# Patient Record
Sex: Female | Born: 1945 | Race: White | Hispanic: No | Marital: Single | Smoking: Never smoker
Health system: Southern US, Community
[De-identification: ages and names within clinical notes are randomized; demographics above are authoritative.]

## PROBLEM LIST (undated history)

## (undated) DIAGNOSIS — E7800 Pure hypercholesterolemia, unspecified: Secondary | ICD-10-CM

## (undated) DIAGNOSIS — K529 Noninfective gastroenteritis and colitis, unspecified: Secondary | ICD-10-CM

## (undated) DIAGNOSIS — K219 Gastro-esophageal reflux disease without esophagitis: Secondary | ICD-10-CM

## (undated) DIAGNOSIS — E039 Hypothyroidism, unspecified: Secondary | ICD-10-CM

## (undated) DIAGNOSIS — M8580 Other specified disorders of bone density and structure, unspecified site: Secondary | ICD-10-CM

## (undated) HISTORY — DX: Gastro-esophageal reflux disease without esophagitis: K21.9

## (undated) HISTORY — DX: Hypothyroidism, unspecified: E03.9

## (undated) HISTORY — DX: Pure hypercholesterolemia, unspecified: E78.00

## (undated) HISTORY — DX: Noninfective gastroenteritis and colitis, unspecified: K52.9

## (undated) HISTORY — DX: Other specified disorders of bone density and structure, unspecified site: M85.80

---

## 1980-05-19 HISTORY — PX: OVARIAN CYST REMOVAL: SHX89

## 1999-05-15 ENCOUNTER — Other Ambulatory Visit: Admission: RE | Admit: 1999-05-15 | Discharge: 1999-05-15 | Payer: Self-pay | Admitting: Obstetrics and Gynecology

## 1999-08-13 ENCOUNTER — Other Ambulatory Visit: Admission: RE | Admit: 1999-08-13 | Discharge: 1999-08-13 | Payer: Self-pay | Admitting: Obstetrics and Gynecology

## 2000-01-20 ENCOUNTER — Other Ambulatory Visit: Admission: RE | Admit: 2000-01-20 | Discharge: 2000-01-20 | Payer: Self-pay | Admitting: Obstetrics and Gynecology

## 2000-01-20 ENCOUNTER — Encounter (INDEPENDENT_AMBULATORY_CARE_PROVIDER_SITE_OTHER): Payer: Self-pay | Admitting: Specialist

## 2000-05-27 ENCOUNTER — Other Ambulatory Visit: Admission: RE | Admit: 2000-05-27 | Discharge: 2000-05-27 | Payer: Self-pay | Admitting: Obstetrics and Gynecology

## 2000-09-21 ENCOUNTER — Encounter: Payer: Self-pay | Admitting: *Deleted

## 2000-09-21 ENCOUNTER — Encounter: Admission: RE | Admit: 2000-09-21 | Discharge: 2000-09-21 | Payer: Self-pay | Admitting: *Deleted

## 2001-10-25 ENCOUNTER — Other Ambulatory Visit: Admission: RE | Admit: 2001-10-25 | Discharge: 2001-10-25 | Payer: Self-pay | Admitting: *Deleted

## 2002-12-03 ENCOUNTER — Emergency Department (HOSPITAL_COMMUNITY): Admission: EM | Admit: 2002-12-03 | Discharge: 2002-12-03 | Payer: Self-pay | Admitting: Emergency Medicine

## 2002-12-03 ENCOUNTER — Encounter: Payer: Self-pay | Admitting: Emergency Medicine

## 2002-12-14 ENCOUNTER — Other Ambulatory Visit: Admission: RE | Admit: 2002-12-14 | Discharge: 2002-12-14 | Payer: Self-pay | Admitting: Family Medicine

## 2002-12-23 ENCOUNTER — Encounter: Admission: RE | Admit: 2002-12-23 | Discharge: 2002-12-23 | Payer: Self-pay

## 2003-01-05 ENCOUNTER — Encounter (INDEPENDENT_AMBULATORY_CARE_PROVIDER_SITE_OTHER): Payer: Self-pay | Admitting: *Deleted

## 2003-01-05 ENCOUNTER — Ambulatory Visit (HOSPITAL_COMMUNITY): Admission: RE | Admit: 2003-01-05 | Discharge: 2003-01-05 | Payer: Self-pay

## 2003-04-03 ENCOUNTER — Encounter: Admission: RE | Admit: 2003-04-03 | Discharge: 2003-04-03 | Payer: Self-pay | Admitting: Endocrinology

## 2003-05-20 DIAGNOSIS — M8580 Other specified disorders of bone density and structure, unspecified site: Secondary | ICD-10-CM

## 2003-05-20 HISTORY — DX: Other specified disorders of bone density and structure, unspecified site: M85.80

## 2003-08-21 ENCOUNTER — Encounter (HOSPITAL_COMMUNITY): Admission: RE | Admit: 2003-08-21 | Discharge: 2003-11-19 | Payer: Self-pay | Admitting: Endocrinology

## 2003-09-11 ENCOUNTER — Ambulatory Visit (HOSPITAL_COMMUNITY): Admission: RE | Admit: 2003-09-11 | Discharge: 2003-09-11 | Payer: Self-pay | Admitting: Endocrinology

## 2003-09-11 ENCOUNTER — Encounter (INDEPENDENT_AMBULATORY_CARE_PROVIDER_SITE_OTHER): Payer: Self-pay | Admitting: *Deleted

## 2003-11-09 ENCOUNTER — Other Ambulatory Visit: Admission: RE | Admit: 2003-11-09 | Discharge: 2003-11-09 | Payer: Self-pay | Admitting: Family Medicine

## 2004-05-08 ENCOUNTER — Ambulatory Visit (HOSPITAL_COMMUNITY): Admission: RE | Admit: 2004-05-08 | Discharge: 2004-05-08 | Payer: Self-pay | Admitting: Gastroenterology

## 2004-05-19 DIAGNOSIS — E039 Hypothyroidism, unspecified: Secondary | ICD-10-CM

## 2004-05-19 HISTORY — DX: Hypothyroidism, unspecified: E03.9

## 2004-12-24 ENCOUNTER — Other Ambulatory Visit: Admission: RE | Admit: 2004-12-24 | Discharge: 2004-12-24 | Payer: Self-pay | Admitting: Family Medicine

## 2005-03-26 ENCOUNTER — Encounter: Admission: RE | Admit: 2005-03-26 | Discharge: 2005-03-26 | Payer: Self-pay | Admitting: Family Medicine

## 2005-04-09 ENCOUNTER — Ambulatory Visit (HOSPITAL_COMMUNITY): Admission: RE | Admit: 2005-04-09 | Discharge: 2005-04-09 | Payer: Self-pay | Admitting: Family Medicine

## 2005-04-09 ENCOUNTER — Encounter (INDEPENDENT_AMBULATORY_CARE_PROVIDER_SITE_OTHER): Payer: Self-pay | Admitting: *Deleted

## 2005-05-19 HISTORY — PX: THYROIDECTOMY: SHX17

## 2005-08-12 ENCOUNTER — Ambulatory Visit (HOSPITAL_COMMUNITY): Admission: RE | Admit: 2005-08-12 | Discharge: 2005-08-13 | Payer: Self-pay | Admitting: Surgery

## 2005-08-12 ENCOUNTER — Encounter (INDEPENDENT_AMBULATORY_CARE_PROVIDER_SITE_OTHER): Payer: Self-pay | Admitting: *Deleted

## 2008-03-22 ENCOUNTER — Other Ambulatory Visit: Admission: RE | Admit: 2008-03-22 | Discharge: 2008-03-22 | Payer: Self-pay | Admitting: Obstetrics and Gynecology

## 2008-11-13 ENCOUNTER — Encounter: Admission: RE | Admit: 2008-11-13 | Discharge: 2009-02-11 | Payer: Self-pay | Admitting: Endocrinology

## 2010-06-09 ENCOUNTER — Encounter: Payer: Self-pay | Admitting: Endocrinology

## 2010-07-18 HISTORY — PX: OTHER SURGICAL HISTORY: SHX169

## 2010-10-04 NOTE — Op Note (Signed)
NAME:  Heidi [name], Heidi [name] NO.:  [id number]   MEDICAL RECORD NO.:  [id number]          PATIENT TYPE:  OIB   LOCATION:  5731                         FACILITY:  [facility]   PHYSICIAN:  Velora Heckler, MD      DATE OF BIRTH:  June 02, 1945   DATE OF PROCEDURE:  08/12/2005  DATE OF DISCHARGE:  08/13/2005                                 OPERATIVE REPORT   PREOPERATIVE DIAGNOSIS:  Multinodular thyroid goiter with compressive  symptoms.   POSTOPERATIVE DIAGNOSIS:  Multinodular thyroid goiter with compressive  symptoms.   PROCEDURE:  Total thyroidectomy.   SURGEON:  Velora Heckler, MD, FACS   ASSISTANT:  Lorelee New, MD, FACS   ANESTHESIA:  General.   ESTIMATED BLOOD LOSS:  Minimal.   PREPARATION:  Betadine.   COMPLICATIONS:  None.   INDICATIONS:  The patient is a 65 year old white female from [place],  [place] who presents at the request of Dr. Dorisann Frames with  multinodular goiter.  The patient had known goiter for many years.  It had  gradually increased in size.  She had developed borderline hyperthyroidism.  She developed compressive symptoms including dysphagia, discomfort, and  voice changes.  The patient now comes to surgery for resection.   BODY OF REPORT:  Procedure is done in OR #1 at the [facility].  The patient is brought to the operating room and placed in a  supine position on the operating room table.  Following the administration  of general anesthesia, the patient is positioned and then prepped and draped  in usual strict aseptic fashion.  After ascertaining that an adequate level  of anesthesia had been obtained, a Kocher incision is made with a #10 blade.  Dissection is carried down through subcutaneous tissues and platysma.  Hemostasis is obtained with the electrocautery.  Skin flaps are developed  cephalad and caudad from the thyroid notch to the sternal notch.  A malar  self-retaining retractor is placed for  exposure.  Strap muscles are incised  in the midline and dissection is begun on the left side.  The left thyroid  lobe is the most prominent.  It measures approximately 11 cm in greatest  dimension.  It extends from the level of the thyroid cartilage to the  anterior mediastinum and down to the aortic arch.  Strap muscles are  reflected laterally.  Middle thyroid vein is divided between medium  Ligaclips.  Using gentle blunt dissection with a Pension scheme manager, the lobe  is exposed.  The superior pole is dissected out initially.  There is a large  cystic nodule in the superior pole.  Superior parathyroid gland tissue is  identified and preserved on its vascular pedicle.  Superior pole vessels are  ligated in continuity with 2-0 silk ties and medium Ligaclips and divided.  Gland is rolled anteriorly.  Branches of the inferior thyroid artery are  divided between small and medium Ligaclips.  Recurrent nerve is identified  and preserved.  Gland is rolled medially.  Inferior venous tributaries are  ligated with 2-0 silk ties and divided.  Substernal component is also gently  mobilized with blunt dissection and delivered up out of the anterior  mediastinum into the neck.  Dissection is carried down to the ligament of  Berry.  Branches of the inferior thyroid artery are divided between small  Ligaclips.  Ligament of Allyson Sabal is transected with the electrocautery, taking  care to avoid the recurrent nerve.  Gland is then mobilized across the  midline.  It is excised off the anterior trachea.  Trachea is markedly  deviated to the right.  Dry packs are placed in the left neck.   Next, we turned our attention to the right thyroid lobe.  Right thyroid lobe  is markedly smaller.  There are less nodules.  There is a 1-cm nodule on the  inferior aspect of the right lobe.  Strap muscles are reflected laterally.  Middle thyroid vein is divided between medium Ligaclips.  Superior pole is  dissected out.   Superior pole vessels are ligated in continuity with 2-0  silk ties and medium Ligaclips and divided.  Gland is rolled anteriorly.  Branches of the inferior thyroid artery are divided between small Ligaclips.  Inferior venous tributaries are divided between medium Ligaclips.  Gland is  rolled anteriorly.  Recurrent nerve was identified and preserved.  Ligament  of Allyson Sabal is transected with the electrocautery and the gland is mobilized up  and onto the trachea, from which the gland is completely excised.  The  entire thyroid gland is then submitted to Pathology for review.  Neck is  irrigated copiously with warm saline.  Hemostasis was obtained with small  and medium Ligaclips.  Surgicel is placed over the area of the recurrent  nerve and parathyroid glands bilaterally.  Strap muscles are reapproximated  in the midline with interrupted 3-0 Vicryl sutures.  Platysma is closed with  interrupted 3-0 Vicryl sutures.  Skin is closed with a running 4-0 Vicryl  subcuticular suture.  Wound is washed and dried and Benzoin and Steri-Strips  are applied.  Sterile dressings are applied.  The patient is awakened from  anesthesia and brought to the recovery room in stable condition.  The  patient tolerated the procedure well.      Velora Heckler, MD  Electronically Signed     TMG/MEDQ  D:  08/12/2005  T:  08/13/2005  Job:  [id number]   cc:   Talmadge Coventry, M.D.  Fax: [phone]   Dorisann Frames, M.D.  Fax: [phone]

## 2010-10-04 NOTE — Op Note (Signed)
NAME:  Heidi [name], Heidi [name] NO.:  [id number]   MEDICAL RECORD NO.:  [id number]          PATIENT TYPE:  AMB   LOCATION:  ENDO                         FACILITY:  [facility]   PHYSICIAN:  Bernette Redbird, M.D.   DATE OF BIRTH:  [birth year]   DATE OF PROCEDURE:  05/08/2004  DATE OF DISCHARGE:                                 OPERATIVE REPORT   PROCEDURE:  Colonoscopy.   INDICATION:  Screening for colon cancer in an asymptomatic 65 year old  without risk factors for colon cancer.   FINDINGS:  Normal exam to the terminal ileum.   DESCRIPTION OF PROCEDURE:  The nature, purpose, and risks of the procedure  had been discussed who provided written consent.  Sedation was fentanyl 90  mcg and Versed 9 mg IV without arrhythmia or desaturation.  The Olympus  adult video colonoscope was advanced to the terminal ileum without  significant difficulty.  Pullback was then performed.  The TI in the colon  had a normal appearance.  The quality of the prep was excellent so it was  felt that all areas were well seen.   No polyps, cancer, colitis, vascular malformations, or diverticulosis were  noted.  Retroflexion showed some hypertrophy at anal papillae.  Re-  inspection of the rectum was normal.  No biopsies were obtained.  The  patient tolerated the procedure well and there were no apparent  complications.   IMPRESSION:  Normal screening exam in a standard-risk individual (V76.51).   PLAN:  Flexible sigmoidoscopy for continued screening in five years.       RB/MEDQ  D:  05/08/2004  T:  05/09/2004  Job:  [id number]   cc:   Talmadge Coventry, M.D.  [address]  [place]  [place] [address]  Fax: [phone]

## 2010-10-04 NOTE — Op Note (Signed)
NAME:  Heidi, [name] NO.:  [id number]   MEDICAL RECORD NO.:  [id number]          PATIENT TYPE:  AMB   LOCATION:  ENDO                         FACILITY:  [facility]   PHYSICIAN:  Bernette Redbird, M.D.   DATE OF BIRTH:  06/30/1945   DATE OF PROCEDURE:  05/08/2004  DATE OF DISCHARGE:                                 OPERATIVE REPORT   PROCEDURE:  Upper endoscopy.   INDICATIONS:  Reflux symptoms, adequately controlled by Nexium.   FINDINGS:  Small hiatal hernia.   PROCEDURE:  The nature, purpose, and risks of the procedure had been  discussed with the patient, who provided written consent.  Sedation was  fentanyl 50 mcg and Versed 6 mg IV without arrhythmias or desaturation.  The  Olympus XQ-140 small-caliber adult video endoscope was passed under direct  vision.  The vocal cords were not well-seen.  The esophagus was readily  entered and was normal in its entirety.  The squamocolumnar junction was  very crisp and clear.  There was no evidence of free reflux, reflux  esophagitis, Barrett's esophagus, varices, infection, or neoplasia.  No ring  or stricture was evident.  There was a 1-2 cm hiatal hernia.   The stomach contained a small bilious residual.  No gastritis, erosions,  ulcers, polyps, or masses were observed, and the pylorus, duodenal bulb, and  second duodenum looked normal.  Retroflexion showed a normal cardia with a  fairly snug diaphragmatic hiatus and no obvious hiatal hernia seen on  retroflex viewing.   The scope was removed from the patient.  No biopsies were obtained.  She  tolerated the procedure well, and there were no apparent complications.   IMPRESSION:  Reflux, without worrisome findings on current examination.  Minimal hiatal hernia present (530.81).   PLAN:  Continue PPI therapy as needed to control symptoms.       RB/MEDQ  D:  05/08/2004  T:  05/08/2004  Job:  [id number]   cc:   Talmadge Coventry, M.D.  [address]   [place]  [place] [address]  Fax: [phone]

## 2011-12-16 ENCOUNTER — Other Ambulatory Visit: Payer: Self-pay | Admitting: Ophthalmology

## 2012-05-18 ENCOUNTER — Other Ambulatory Visit: Payer: Self-pay | Admitting: Dermatology

## 2012-08-30 ENCOUNTER — Encounter: Payer: Self-pay | Admitting: *Deleted

## 2012-08-31 ENCOUNTER — Ambulatory Visit (INDEPENDENT_AMBULATORY_CARE_PROVIDER_SITE_OTHER): Payer: BC Managed Care – PPO | Admitting: Obstetrics and Gynecology

## 2012-08-31 VITALS — BP 130/74 | Ht 61.5 in | Wt 192.8 lb

## 2012-08-31 DIAGNOSIS — Z0000 Encounter for general adult medical examination without abnormal findings: Secondary | ICD-10-CM

## 2012-08-31 DIAGNOSIS — Z01419 Encounter for gynecological examination (general) (routine) without abnormal findings: Secondary | ICD-10-CM

## 2012-08-31 LAB — POCT URINALYSIS DIPSTICK
Leukocytes, UA: NEGATIVE
pH, UA: 7

## 2012-08-31 NOTE — Patient Instructions (Addendum)

## 2012-08-31 NOTE — Progress Notes (Signed)
67 y.o.  Divorced Caucasian female   G0P0. here for annual exam.  No vaginal bleeding.  No health changes.  Has taken on more responsibility at work, which she always promised herself she wouldn't do, but that extra work should end soon and she will have the summer off.    Patient's last menstrual period was 06/07/2012.          Sexually active: no  The current method of family planning is post menopausal status.    Exercising: no Last mammogram:  10/2011  Last pap smear: 04/22/10 History of abnormal pap: none  Smoking: no Alcohol: rarely Last colonoscopy: 07/2010 Last Bone Density: 06/2010  Last tetanus shot: 03/29/08 Last cholesterol check: unsure 2013   Hgb:   PCP             Urine: NEG    Health Maintenance  Topic Date Due  . Tetanus/tdap  04/25/1967  . Mammogram  04/24/1998  . Colonoscopy  04/24/1998  . Zostavax  04/24/2008  . Influenza Vaccine  01/17/2013  . Pap Smear  04/22/2013    No family history on file.  There is no problem list on file for this patient.   Past Medical History  Diagnosis Date  . Osteopenia 2005  . GERD (gastroesophageal reflux disease)   . Hypothyroidism 2006    Past Surgical History  Procedure Laterality Date  . Ovarian cyst removal  1982    benign  . Colonoscopy  07/2010    normal     Allergies: Codone  Current Outpatient Prescriptions  Medication Sig Dispense Refill  . COD LIVER OIL PO Take by mouth daily.      Marland Kitchen dexlansoprazole (DEXILANT) 60 MG capsule Take 60 mg by mouth daily.      Marland Kitchen ezetimibe-simvastatin (VYTORIN) 10-40 MG per tablet Take 1 tablet by mouth daily.      Marland Kitchen levothyroxine (SYNTHROID) 100 MCG tablet Take 100 mcg by mouth daily.      . Misc Natural Products (OSTEO BI-FLEX ADV DOUBLE ST PO) Take by mouth daily.      . Multiple Vitamins-Minerals (MULTIVITAMIN PO) Take by mouth daily.      . Vitamin D, Ergocalciferol, (DRISDOL) 50000 UNITS CAPS Take 50,000 Units by mouth every 7 (seven) days.       No current  facility-administered medications for this visit.    ROS: Pertinent items are noted in HPI.  Social ZO:XWRUEAVW, no children, taking care of her mom and dad who are both in their late 43's.  Works as an Secondary school teacher at [employer]    Exam:    BP 130/74  Ht 5' 1.5" (1.562 m)  Wt 192 lb 12.8 oz (87.454 kg)  BMI 35.84 kg/m2  LMP 06/07/2012  Down 8 pounds from last year Wt Readings from Last 3 Encounters:  08/31/12 192 lb 12.8 oz (87.454 kg)     Ht Readings from Last 3 Encounters:  08/31/12 5' 1.5" (1.562 m)  Height stable from last year.   General appearance: alert, cooperative and appears stated age Head: Normocephalic, without obvious abnormality, atraumatic Neck: no adenopathy, supple, symmetrical, trachea midline and thyroid not enlarged, symmetric, no tenderness/mass/nodules Lungs: clear to auscultation bilaterally Breasts: Inspection negative, No nipple retraction or dimpling, No nipple discharge or bleeding, No axillary or supraclavicular adenopathy, Normal to palpation without dominant masses Heart: regular rate and rhythm Abdomen: soft, non-tender; bowel sounds normal; no masses,  no organomegaly Extremities: extremities normal, atraumatic, no cyanosis or edema Skin: Skin color, texture, turgor  normal. No rashes or lesions Lymph nodes: Cervical, supraclavicular, and axillary nodes normal. No abnormal inguinal nodes palpated Neurologic: Grossly normal   Pelvic: External genitalia:  no lesions              Urethra:  normal appearing urethra with no masses, tenderness or lesions              Bartholins and Skenes: normal                 Vagina: normal appearing vagina with normal color and discharge, no lesions              Cervix: normal appearance              Pap taken: yes        Bimanual Exam:  Uterus:  uterus is normal size, shape, consistency and nontender, mid position , mobile                                      Adnexa: normal adnexa in size, nontender and no masses                                       Rectovaginal: Confirms                                      Anus:  normal sphincter tone, no lesions  A: normal menopausal exam, no HRT     Hypothyroid, see Dr. Horald Pollen     Elevated chol, GERD see Shaune Pollack    P: mammogram pap smear counseled on breast self exam, mammography screening, adequate intake of calcium and vitamin D, diet and exercise return annually or prn   Consider BMD next year   An After Visit Summary was printed and given to the patient.

## 2012-09-03 LAB — IPS PAP TEST WITH HPV

## 2012-09-06 ENCOUNTER — Encounter: Payer: Self-pay | Admitting: *Deleted

## 2013-09-16 ENCOUNTER — Ambulatory Visit: Payer: BC Managed Care – PPO | Admitting: Obstetrics and Gynecology

## 2013-09-22 ENCOUNTER — Telehealth: Payer: Self-pay | Admitting: Obstetrics and Gynecology

## 2013-09-22 NOTE — Telephone Encounter (Signed)
Confirming patient appt

## 2013-09-23 ENCOUNTER — Ambulatory Visit: Payer: BC Managed Care – PPO | Admitting: Obstetrics and Gynecology

## 2013-09-27 NOTE — Telephone Encounter (Signed)
Pt confirmed

## 2013-09-29 ENCOUNTER — Ambulatory Visit (INDEPENDENT_AMBULATORY_CARE_PROVIDER_SITE_OTHER): Payer: BC Managed Care – PPO | Admitting: Obstetrics and Gynecology

## 2013-09-29 ENCOUNTER — Encounter: Payer: Self-pay | Admitting: Obstetrics and Gynecology

## 2013-09-29 VITALS — BP 140/74 | HR 84 | Temp 98.0°F | Ht 61.25 in | Wt 202.8 lb

## 2013-09-29 DIAGNOSIS — Z01419 Encounter for gynecological examination (general) (routine) without abnormal findings: Secondary | ICD-10-CM

## 2013-09-29 DIAGNOSIS — E559 Vitamin D deficiency, unspecified: Secondary | ICD-10-CM

## 2013-09-29 DIAGNOSIS — Z0000 Encounter for general adult medical examination without abnormal findings: Secondary | ICD-10-CM

## 2013-09-29 LAB — CBC
HEMATOCRIT: 39.3 % (ref 36.0–46.0)
Hemoglobin: 13.3 g/dL (ref 12.0–15.0)
MCH: 30.4 pg (ref 26.0–34.0)
MCHC: 33.8 g/dL (ref 30.0–36.0)
MCV: 89.7 fL (ref 78.0–100.0)
PLATELETS: 310 10*3/uL (ref 150–400)
RBC: 4.38 MIL/uL (ref 3.87–5.11)
RDW: 13.6 % (ref 11.5–15.5)
WBC: 5.2 10*3/uL (ref 4.0–10.5)

## 2013-09-29 LAB — HEMOGLOBIN, FINGERSTICK: HEMOGLOBIN, FINGERSTICK: 13.9 g/dL (ref 12.0–16.0)

## 2013-09-29 LAB — POCT URINALYSIS DIPSTICK
BILIRUBIN UA: NEGATIVE
GLUCOSE UA: NEGATIVE
Ketones, UA: NEGATIVE
Nitrite, UA: NEGATIVE
Protein, UA: NEGATIVE
RBC UA: NEGATIVE
UROBILINOGEN UA: NEGATIVE
pH, UA: 6.5

## 2013-09-29 NOTE — Progress Notes (Signed)
Patient ID: Heidi Frank, female   DOB: 03/17/1946, 68 y.o.   MRN: [id number] GYNECOLOGY VISIT  PCP:  Shaune Pollackonna Gates, MD  Referring provider:   HPI: 68 y.o.   Single  Caucasian  female   G0P0 with LMP 2001.  here for annual exam Requesting pap.   Wants to work on weight loss through diet and exercise.   Retired and Geophysical data processorsold house. May move to be closer to her parents.  Hgb:  13.9 Urine:  Trace leuks - asymptomatic.   No UTIs.    GYNECOLOGIC HISTORY: Patient's last menstrual period was 06/07/2012.  Menopausal hormone therapy: never.  GYN procedures and prior surgeries:  Laparoscopy for left ovarian cystectomy - benign - early 1980s. Last mammogram:    June 2014 - [facility].               Last pap and high risk HPV testing:   08/2012 - WNL, negative HR HPV History of abnormal pap smear:  May have had one remote abnormal pap.  No treatments.    OB History   Grav Para Term Preterm Abortions TAB SAB Ect Mult Living   0                LIFESTYLE: Exercise: no            Tobacco: no Alcohol: no Drug use: no  OTHER HEALTH MAINTENANCE: Tetanus/TDap:  03/29/08 Gardisil:  NA Influenza:  2014 Zostavax:  2010  Bone density: 2/12 - Dr. Romero BellingBalin - now normal.  Charlies Constableook Boniva in the past for a short period of time.  Chose to stop. Off Vit D Rx. Colonoscopy:  3/12 - WNL  Cholesterol check: - wants check  Family History  Problem Relation Age of Onset  . Heart failure Mother   . Melanoma Father   . Osteoarthritis Father   . Heart attack Brother   . Esophageal cancer Maternal Aunt   . Breast cancer Maternal Grandmother   . Kidney disease Maternal Grandfather   . Colon cancer Paternal Grandfather     There are no active problems to display for this patient.  Past Medical History  Diagnosis Date  . Osteopenia 2005  . GERD (gastroesophageal reflux disease)   . Hypothyroidism 2006    Past Surgical History  Procedure Laterality Date  . Ovarian cyst removal  1982    benign  .  Colonoscopy  07/2010    normal   . Thyroidectomy  2007    ALLERGIES: Codone  Current Outpatient Prescriptions  Medication Sig Dispense Refill  . COD LIVER OIL PO Take by mouth daily.      Marland Kitchen. dexlansoprazole (DEXILANT) 60 MG capsule Take 60 mg by mouth daily.      Marland Kitchen. ezetimibe-simvastatin (VYTORIN) 10-40 MG per tablet Take 1 tablet by mouth daily.      Marland Kitchen. levocetirizine (XYZAL) 5 MG tablet Take 5 mg by mouth every evening.      Marland Kitchen. levothyroxine (SYNTHROID) 100 MCG tablet Take 100 mcg by mouth daily.      . Misc Natural Products (OSTEO BI-FLEX ADV DOUBLE ST PO) Take by mouth daily.      . Multiple Vitamins-Minerals (MULTIVITAMIN PO) Take by mouth daily.       No current facility-administered medications for this visit.     ROS:  Pertinent items are noted in HPI.  SOCIAL HISTORY:  Single,  Retired. May move to be closer to parents.   PHYSICAL EXAMINATION:    BP 140/74  Pulse  84  Temp(Src) 98 F (36.7 C)  Ht 5' 1.25" (1.556 m)  Wt 202 lb 12.8 oz (91.989 kg)  BMI 37.99 kg/m2  LMP 06/07/2012   Wt Readings from Last 3 Encounters:  09/29/13 202 lb 12.8 oz (91.989 kg)  08/31/12 192 lb 12.8 oz (87.454 kg)     Ht Readings from Last 3 Encounters:  09/29/13 5' 1.25" (1.556 m)  08/31/12 5' 1.5" (1.562 m)    General appearance: alert, cooperative and appears stated age Head: Normocephalic, without obvious abnormality, atraumatic Neck: no adenopathy, supple, symmetrical, trachea midline and thyroid not enlarged, symmetric, no tenderness/mass/nodules Lungs: clear to auscultation bilaterally Breasts: Inspection negative, No nipple retraction or dimpling, No nipple discharge or bleeding, No axillary or supraclavicular adenopathy, Normal to palpation without dominant masses Heart: regular rate and rhythm Abdomen: soft, non-tender; no masses,  no organomegaly Extremities: extremities normal, atraumatic, no cyanosis or edema Skin: Skin color, texture, turgor normal. No rashes or  lesions Lymph nodes: Cervical, supraclavicular, and axillary nodes normal. No abnormal inguinal nodes palpated Neurologic: Grossly normal  Pelvic: External genitalia:  no lesions              Urethra:  normal appearing urethra with no masses, tenderness or lesions              Bartholins and Skenes: normal                 Vagina: normal appearing vagina with atrophy noted.               Cervix: normal appearance              Pap and high risk HPV testing done: yes.            Bimanual Exam:  Uterus:  uterus is normal size, shape, consistency and nontender                                      Adnexa: normal adnexa in size, nontender and no masses                                      Rectovaginal: Confirms                                      Anus:  normal sphincter tone, no lesions  ASSESSMENT  Normal gynecologic exam. Vaginal atrophy.  Status post thyroidectomy.  On Synthroid.  Obesity.   PLAN  Mammogram recommended yearly.  Pap smear and high risk HPV testing performed. Declines vaginal estrogen treatment for atrophy.  Lipid profile, CMP, Vit D level. Thyroid care with Dr. Romero BellingBalin. Counseled on self breast exam, Calcium and vitamin D intake, exercise, weight loss. Return annually or prn   An After Visit Summary was printed and given to the patient.

## 2013-09-29 NOTE — Progress Notes (Deleted)
Patient ID: Heidi BoatmanJanie S Frank, female   DOB: 05/26/1945, 68 y.o.   MRN: [id number] 68 y.o.   Single    Caucasian   female   No obstetric history on file.   here for annual exam.    Patient's last menstrual period was 06/07/2012.          Sexually active: {yes no:314532}  The current method of family planning is {contraception:315051}.    Exercising:  Last mammogram:   Last pap smear: History of abnormal pap:  Smoking: Alcohol: Last colonoscopy: Last Bone Density:   Last tetanus shot: Last cholesterol check:   Hgb:                Urine:   No family history on file.  There are no active problems to display for this patient.   Past Medical History  Diagnosis Date  . Osteopenia 2005  . GERD (gastroesophageal reflux disease)   . Hypothyroidism 2006    Past Surgical History  Procedure Laterality Date  . Ovarian cyst removal  1982    benign  . Colonoscopy  07/2010    normal     Allergies: Codone  Current Outpatient Prescriptions  Medication Sig Dispense Refill  . COD LIVER OIL PO Take by mouth daily.      Marland Kitchen. dexlansoprazole (DEXILANT) 60 MG capsule Take 60 mg by mouth daily.      Marland Kitchen. ezetimibe-simvastatin (VYTORIN) 10-40 MG per tablet Take 1 tablet by mouth daily.      Marland Kitchen. levothyroxine (SYNTHROID) 100 MCG tablet Take 100 mcg by mouth daily.      . Misc Natural Products (OSTEO BI-FLEX ADV DOUBLE ST PO) Take by mouth daily.      . Multiple Vitamins-Minerals (MULTIVITAMIN PO) Take by mouth daily.      . Vitamin D, Ergocalciferol, (DRISDOL) 50000 UNITS CAPS Take 50,000 Units by mouth every 7 (seven) days.       No current facility-administered medications for this visit.    ROS: Pertinent items are noted in HPI.  Social Hx:    Exam:    BP 140/74  Pulse 84  Temp(Src) 98 F (36.7 C)  Ht 5' 1.25" (1.556 m)  Wt 202 lb 12.8 oz (91.989 kg)  BMI 37.99 kg/m2  LMP 06/07/2012   Wt Readings from Last 3 Encounters:  09/29/13 202 lb 12.8 oz (91.989 kg)  08/31/12 192 lb 12.8 oz  (87.454 kg)     Ht Readings from Last 3 Encounters:  09/29/13 5' 1.25" (1.556 m)  08/31/12 5' 1.5" (1.562 m)    General appearance: alert, cooperative and appears stated age Head: Normocephalic, without obvious abnormality, atraumatic Neck: no adenopathy, supple, symmetrical, trachea midline and thyroid not enlarged, symmetric, no tenderness/mass/nodules Lungs: clear to auscultation bilaterally Breasts: Inspection negative, No nipple retraction or dimpling, No nipple discharge or bleeding, No axillary or supraclavicular adenopathy, Normal to palpation without dominant masses Heart: regular rate and rhythm Abdomen: soft, non-tender; bowel sounds normal; no masses,  no organomegaly Extremities: extremities normal, atraumatic, no cyanosis or edema Skin: Skin color, texture, turgor normal. No rashes or lesions Lymph nodes: Cervical, supraclavicular, and axillary nodes normal. No abnormal inguinal nodes palpated Neurologic: Grossly normal   Pelvic: External genitalia:  no lesions              Urethra:  normal appearing urethra with no masses, tenderness or lesions              Bartholins and Skenes: normal  Vagina: normal appearing vagina with normal color and discharge, no lesions              Cervix: normal appearance              Pap taken: {yes no:314532}        Bimanual Exam:  Uterus:  uterus is normal size, shape, consistency and nontender                                      Adnexa: normal adnexa in size, nontender and no masses                                      Rectovaginal: Confirms                                      Anus:  normal sphincter tone, no lesions  A: normal menopausal exam     P:     {plan; gyn:5269::"mammogram","pap smear","return annually or prn"}     An After Visit Summary was printed and given to the patient.

## 2013-09-29 NOTE — Patient Instructions (Signed)

## 2013-09-30 LAB — COMPREHENSIVE METABOLIC PANEL
ALK PHOS: 73 U/L (ref 39–117)
ALT: 21 U/L (ref 0–35)
AST: 27 U/L (ref 0–37)
Albumin: 4.4 g/dL (ref 3.5–5.2)
BUN: 12 mg/dL (ref 6–23)
CALCIUM: 9.6 mg/dL (ref 8.4–10.5)
CO2: 25 mEq/L (ref 19–32)
Chloride: 102 mEq/L (ref 96–112)
Creat: 0.66 mg/dL (ref 0.50–1.10)
GLUCOSE: 94 mg/dL (ref 70–99)
POTASSIUM: 4 meq/L (ref 3.5–5.3)
SODIUM: 139 meq/L (ref 135–145)
Total Bilirubin: 0.4 mg/dL (ref 0.2–1.2)
Total Protein: 7.1 g/dL (ref 6.0–8.3)

## 2013-09-30 LAB — LIPID PANEL
CHOL/HDL RATIO: 2.9 ratio
CHOLESTEROL: 134 mg/dL (ref 0–200)
HDL: 47 mg/dL (ref >39)
LDL CALC: 66 mg/dL (ref 0–99)
TRIGLYCERIDES: 107 mg/dL (ref <150)
VLDL: 21 mg/dL (ref 0–40)

## 2013-09-30 LAB — VITAMIN D 25 HYDROXY (VIT D DEFICIENCY, FRACTURES): VIT D 25 HYDROXY: 35 ng/mL (ref 30–89)

## 2013-10-03 ENCOUNTER — Telehealth: Payer: Self-pay

## 2013-10-03 NOTE — Telephone Encounter (Signed)
Message copied by Alphonsa OverallIXON, Bevin Mayall L on Mon Oct 03, 2013 10:37 AM ------      Message from: Ricki MillerAMUNDSON DE Gwenevere GhaziARVALHO E SILVA, BROOK E      Created: Fri Sep 30, 2013  7:52 AM       Please report normal blood work to patient -       Normal cholesterol, metabolic profile, Vit D, and CBC. ------

## 2013-10-03 NOTE — Telephone Encounter (Signed)
LMOVM to call to discuss lab results.

## 2013-10-03 NOTE — Telephone Encounter (Signed)
Patient notified labs normal.

## 2013-10-05 LAB — IPS PAP TEST WITH HPV

## 2013-12-01 ENCOUNTER — Emergency Department (HOSPITAL_COMMUNITY): Payer: PRIVATE HEALTH INSURANCE

## 2013-12-01 ENCOUNTER — Encounter (HOSPITAL_COMMUNITY): Payer: Self-pay | Admitting: Emergency Medicine

## 2013-12-01 ENCOUNTER — Emergency Department (HOSPITAL_COMMUNITY)
Admission: EM | Admit: 2013-12-01 | Discharge: 2013-12-01 | Disposition: A | Discharge status: Home / Self Care | Payer: PRIVATE HEALTH INSURANCE | Attending: Emergency Medicine | Admitting: Emergency Medicine

## 2013-12-01 DIAGNOSIS — A084 Viral intestinal infection, unspecified: Secondary | ICD-10-CM

## 2013-12-01 DIAGNOSIS — K219 Gastro-esophageal reflux disease without esophagitis: Secondary | ICD-10-CM | POA: Insufficient documentation

## 2013-12-01 DIAGNOSIS — A088 Other specified intestinal infections: Secondary | ICD-10-CM | POA: Insufficient documentation

## 2013-12-01 DIAGNOSIS — E039 Hypothyroidism, unspecified: Secondary | ICD-10-CM | POA: Insufficient documentation

## 2013-12-01 DIAGNOSIS — Z8739 Personal history of other diseases of the musculoskeletal system and connective tissue: Secondary | ICD-10-CM | POA: Insufficient documentation

## 2013-12-01 DIAGNOSIS — R42 Dizziness and giddiness: Secondary | ICD-10-CM | POA: Insufficient documentation

## 2013-12-01 DIAGNOSIS — Z79899 Other long term (current) drug therapy: Secondary | ICD-10-CM | POA: Insufficient documentation

## 2013-12-01 LAB — CBC
HCT: 43.1 % (ref 36.0–46.0)
Hemoglobin: 14.5 g/dL (ref 12.0–15.0)
MCH: 31 pg (ref 26.0–34.0)
MCHC: 33.6 g/dL (ref 30.0–36.0)
MCV: 92.1 fL (ref 78.0–100.0)
PLATELETS: 269 10*3/uL (ref 150–400)
RBC: 4.68 MIL/uL (ref 3.87–5.11)
RDW: 12.6 % (ref 11.5–15.5)
WBC: 10.1 10*3/uL (ref 4.0–10.5)

## 2013-12-01 LAB — HEPATIC FUNCTION PANEL
ALT: 17 U/L (ref 0–35)
AST: 20 U/L (ref 0–37)
Albumin: 4.1 g/dL (ref 3.5–5.2)
Alkaline Phosphatase: 87 U/L (ref 39–117)
Bilirubin, Direct: <0.2 mg/dL (ref 0.0–0.3)
Total Bilirubin: 0.2 mg/dL — ABNORMAL LOW (ref 0.3–1.2)
Total Protein: 7.6 g/dL (ref 6.0–8.3)

## 2013-12-01 LAB — BASIC METABOLIC PANEL
Anion gap: 16 — ABNORMAL HIGH (ref 5–15)
BUN: 14 mg/dL (ref 6–23)
CALCIUM: 9.5 mg/dL (ref 8.4–10.5)
CO2: 25 meq/L (ref 19–32)
Chloride: 103 mEq/L (ref 96–112)
Creatinine, Ser: 0.47 mg/dL — ABNORMAL LOW (ref 0.50–1.10)
GFR calc non Af Amer: >90 mL/min (ref >90)
Glucose, Bld: 136 mg/dL — ABNORMAL HIGH (ref 70–99)
POTASSIUM: 3.8 meq/L (ref 3.7–5.3)
SODIUM: 144 meq/L (ref 137–147)

## 2013-12-01 LAB — LIPASE, BLOOD: LIPASE: 24 U/L (ref 11–59)

## 2013-12-01 MED ORDER — ONDANSETRON HCL 4 MG PO TABS: 4.0000 mg | ORAL_TABLET | Freq: Four times a day (QID) | ORAL | Status: AC

## 2013-12-01 MED ORDER — ONDANSETRON 4 MG PO TBDP
4.0000 mg | ORAL_TABLET | Freq: Once | ORAL | Status: AC
  Administered 2013-12-01: 4 mg via ORAL
  Filled 2013-12-01: qty 1

## 2013-12-01 MED ORDER — SODIUM CHLORIDE 0.9 % IV BOLUS (SEPSIS)
1000.0000 mL | Freq: Once | INTRAVENOUS | Status: AC
  Administered 2013-12-01: 1000 mL via INTRAVENOUS

## 2013-12-01 MED ORDER — GI COCKTAIL ~~LOC~~
30.0000 mL | Freq: Once | ORAL | Status: AC
  Administered 2013-12-01: 30 mL via ORAL
  Filled 2013-12-01: qty 30

## 2013-12-01 MED ORDER — IBUPROFEN 800 MG PO TABS
800.0000 mg | ORAL_TABLET | Freq: Once | ORAL | Status: AC
  Administered 2013-12-01: 800 mg via ORAL
  Filled 2013-12-01: qty 1

## 2013-12-01 NOTE — ED Provider Notes (Signed)
Pt seen and evaluated.  Discussed with PA Neva SeatGreene.  Nausea resolved.  Pt denies diarrhea.  Taking PO fluids.  Benign, non-tender abdomen. Reassuring labs. Plan to Dc home with symptomatic treatment for nausea/vomiting.  Rolland PorterMark Illana Nolting, MD 12/01/13 2006

## 2013-12-01 NOTE — ED Notes (Signed)
Pt c/o nausea and lightheadedness since yesterday morning. Pt sts the lightheaded feeling increases with movement and standing up. Pt reports she has also been having diarrhea and chills, denies taking temperature at home. Denies abd pain. Denies blood in stool/emesis. Nad, skin warm and dry, resp e/u.

## 2013-12-01 NOTE — ED Notes (Signed)
Tiffany, PA at bedside.

## 2013-12-01 NOTE — ED Notes (Signed)
Brought pt to room via wheelchair with family in tow; pt undressed, in gown, on monitor, continuous pulse oximetry and blood pressure cuff; family at bedside; Chastity, NT aware pt is in room

## 2013-12-01 NOTE — ED Provider Notes (Signed)
CSN: [id number]     Arrival date & time 12/01/13  1340 History   First MD Initiated Contact with Patient 12/01/13 1726     Chief Complaint  Patient presents with  . Nausea  . Dizziness  . Headache  . Diarrhea     (Consider location/radiation/quality/duration/timing/severity/associated sxs/prior Treatment) HPI  The patient presents to the emergency department for evaluation of nausea and vomiting that started yesterday morning She does not remember eating anything left over or undercooked. She has had some cramping prior to vomiting but no abdominal pains. She has also had intermittent loose stool. She feels as though she has been unable to keep down any PO. She has had an ovary removed in the past but no other abdominal surgery.   She has developed associated light headedness when changing positions only and chills. She has not had fevers, bloody bowel or vomiting, abdominal pains, weakness, confusion, distention, lower extremity swelling, CP, SOB.  Past Medical History  Diagnosis Date  . Osteopenia 2005  . GERD (gastroesophageal reflux disease)   . Hypothyroidism 2006   Past Surgical History  Procedure Laterality Date  . Ovarian cyst removal  1982    benign  . Colonoscopy  07/2010    normal   . Thyroidectomy  2007   Family History  Problem Relation Age of Onset  . Heart failure Mother   . Melanoma Father   . Osteoarthritis Father   . Heart attack Brother   . Esophageal cancer Maternal Aunt   . Breast cancer Maternal Grandmother   . Kidney disease Maternal Grandfather   . Colon cancer Paternal Grandfather    History  Substance Use Topics  . Smoking status: Never Smoker   . Smokeless tobacco: Not on file  . Alcohol Use: No   OB History   Grav Para Term Preterm Abortions TAB SAB Ect Mult Living   0              Review of Systems   Review of Systems  Gen: no weight loss, fevers,  night sweats + chills, Eyes: no discharge or drainage, no occular pain or visual  changes  Nose: no epistaxis or rhinorrhea  Mouth: no dental pain, no sore throat  Neck: no neck pain  Lungs:No wheezing, coughing or hemoptysis CV: no chest pain, palpitations, dependent edema or orthopnea  Abd: +  nausea, vomiting, diarrhea  No abdominal pain, GU: no dysuria or gross hematuria  MSK:  No muscle weakness or pain Neuro: no headache, no focal neurologic deficits + positional lightheadedness  Skin: no rash or wounds Psyche: no complaints    Allergies  Codone  Home Medications   Prior to Admission medications   Medication Sig Start Date End Date Taking? Authorizing Provider  Cholecalciferol (VITAMIN D) 2000 UNITS CAPS Take 1 capsule by mouth daily.   Yes Historical Provider, MD  COD LIVER OIL PO Take 1 tablet by mouth daily.    Yes Historical Provider, MD  dexlansoprazole (DEXILANT) 60 MG capsule Take 60 mg by mouth daily.   Yes Historical Provider, MD  ezetimibe-simvastatin (VYTORIN) 10-40 MG per tablet Take 1 tablet by mouth daily.   Yes Historical Provider, MD  levocetirizine (XYZAL) 5 MG tablet Take 5 mg by mouth every evening.   Yes Historical Provider, MD  levothyroxine (SYNTHROID) 100 MCG tablet Take 100 mcg by mouth daily.   Yes Historical Provider, MD  Misc Natural Products (OSTEO BI-FLEX ADV DOUBLE ST PO) Take by mouth daily.   Yes  Historical Provider, MD  Multiple Vitamins-Minerals (MULTIVITAMIN PO) Take by mouth daily.   Yes Historical Provider, MD  ondansetron (ZOFRAN) 4 MG tablet Take 1 tablet (4 mg total) by mouth every 6 (six) hours. 12/01/13   Kenston Longton Irine Seal, PA-C   BP 134/64  Pulse 78  Temp(Src) 98.4 F (36.9 C) (Oral)  Resp 17  Ht 5\' 2"  (1.575 m)  Wt 185 lb (83.915 kg)  BMI 33.83 kg/m2  SpO2 99%  LMP 06/07/2012 Physical Exam  Nursing note and vitals reviewed. Constitutional: She is oriented to person, place, and time. She appears well-developed and well-nourished. No distress.  HENT:  Head: Normocephalic and atraumatic.  Eyes: Pupils are  equal, round, and reactive to light.  Neck: Normal range of motion. Neck supple.  Cardiovascular: Normal rate and regular rhythm.   Pulmonary/Chest: Effort normal.  Abdominal: Soft. Bowel sounds are normal. She exhibits no ascites. There is no tenderness. There is no rebound, no guarding and no CVA tenderness.  Neurological: She is alert and oriented to person, place, and time. She has normal strength. No cranial nerve deficit or sensory deficit.  Skin: Skin is warm and dry.    ED Course  Procedures (including critical care time) Labs Review Labs Reviewed  BASIC METABOLIC PANEL - Abnormal; Notable for the following:    Glucose, Bld 136 (*)    Creatinine, Ser 0.47 (*)    Anion gap 16 (*)    All other components within normal limits  HEPATIC FUNCTION PANEL - Abnormal; Notable for the following:    Total Bilirubin 0.2 (*)    All other components within normal limits  CBC  LIPASE, BLOOD    Imaging Review Dg Abd 2 Views  12/01/2013   CLINICAL DATA:  Nausea and diarrhea two days.  EXAM: ABDOMEN - 2 VIEW  COMPARISON:  None.  FINDINGS: Bowel gas pattern is nonobstructive. There is no free peritoneal air. There are a few dense rounded foci projected over the left upper quadrant with the largest measuring 2.1 cm of uncertain clinical significance and may represent benign calcified structure within the overlying soft tissues versus calcified splenic granulomas. Mild degenerative change of the spine. Few calcifications over the pelvis likely phleboliths.  IMPRESSION: Nonobstructive bowel gas pattern.   Electronically Signed   By: Elberta Fortis M.D.   On: 12/01/2013 19:03     EKG Interpretation None      MDM   Final diagnoses:  Viral gastroenteritis    Medications  sodium chloride 0.9 % bolus 1,000 mL (1,000 mLs Intravenous New Bag/Given 12/01/13 1913)  ibuprofen (ADVIL,MOTRIN) tablet 800 mg (not administered)  ondansetron (ZOFRAN-ODT) disintegrating tablet 4 mg (not administered)   ondansetron (ZOFRAN-ODT) disintegrating tablet 4 mg (4 mg Oral Given 12/01/13 1409)  gi cocktail (Maalox,Lidocaine,Donnatal) (30 mLs Oral Given 12/01/13 1847)   2 View abdomen pending  She is feeling much better after the interventions given here in the ED. She had an UA that we attempted to obtain from her but she was unable to provider a sample after work-up and intervention was completed. She has had no suprapubic tenderness, fevers or abdominal pain. At this time I will cancel the UA and have her return ASAP if she develops fever worsening.  Dr. Fayrene Fearing saw the patient as well and agreed that she is safe to go home at this time. She was fluid challenged in the ED. NO adverse events and felt fine after drinking the fluids.  Will discharge with Zofran.  67 y.o.Wille Celeste  S Lacko's evaluation in the Emergency Department is complete. It has been determined that no acute conditions requiring further emergency intervention are present at this time. The patient/guardian have been advised of the diagnosis and plan. We have discussed signs and symptoms that warrant return to the ED, such as changes or worsening in symptoms.  Vital signs are stable at discharge. Filed Vitals:   12/01/13 1930  BP: 134/64  Pulse: 78  Temp:   Resp:     Patient/guardian has voiced understanding and agreed to follow-up with the PCP or specialist.      Dorthula Matas, PA-C 12/01/13 2010

## 2013-12-01 NOTE — ED Notes (Signed)
Patient transported to X-ray 

## 2013-12-01 NOTE — Discharge Instructions (Signed)

## 2013-12-10 NOTE — ED Provider Notes (Signed)
Medical screening examination/treatment/procedure(s) were performed by non-physician practitioner and as supervising physician I was immediately available for consultation/collaboration.   EKG Interpretation   Date/Time:  Thursday December 01 2013 14:20:18 EDT Ventricular Rate:  88 PR Interval:  140 QRS Duration: 82 QT Interval:  388 QTC Calculation: 469 R Axis:   38 Text Interpretation:  Normal sinus rhythm Low voltage QRS Borderline ECG  ED PHYSICIAN INTERPRETATION AVAILABLE IN [facility] Confirmed by  TEST, Record ([id number]) on 12/03/2013 12:20:17 PM        Rolland PorterMark Aisa Schoeppner, MD 12/10/13 1526

## 2014-01-30 ENCOUNTER — Encounter: Payer: Self-pay | Admitting: Obstetrics and Gynecology

## 2014-10-04 ENCOUNTER — Ambulatory Visit: Payer: BC Managed Care – PPO | Admitting: Obstetrics and Gynecology

## 2014-10-04 ENCOUNTER — Encounter: Payer: Self-pay | Admitting: Obstetrics and Gynecology

## 2014-10-04 ENCOUNTER — Ambulatory Visit (INDEPENDENT_AMBULATORY_CARE_PROVIDER_SITE_OTHER): Payer: Medicare Other | Admitting: Obstetrics and Gynecology

## 2014-10-04 VITALS — BP 120/70 | HR 84 | Resp 18 | Ht 61.25 in | Wt 197.8 lb

## 2014-10-04 DIAGNOSIS — Z0000 Encounter for general adult medical examination without abnormal findings: Secondary | ICD-10-CM

## 2014-10-04 DIAGNOSIS — Z01419 Encounter for gynecological examination (general) (routine) without abnormal findings: Secondary | ICD-10-CM

## 2014-10-04 LAB — POCT URINALYSIS DIPSTICK
Bilirubin, UA: NEGATIVE
Blood, UA: NEGATIVE
Glucose, UA: NEGATIVE
Ketones, UA: NEGATIVE
Leukocytes, UA: NEGATIVE
Nitrite, UA: NEGATIVE
Protein, UA: NEGATIVE
Urobilinogen, UA: NEGATIVE
pH, UA: 5

## 2014-10-04 NOTE — Progress Notes (Signed)
Patient ID: Heidi BoatmanJanie S Frank, female   DOB: 06/07/1945, 69 y.o.   MRN: [id number] 69 y.o. G0P0 Single Caucasian female here for annual exam.    Moved closer to her parents to help them more.  Retired.   Thyroid care with Dr. Horald PollenBalen.   Having hair loss.  Using Rogaine.   Decreased energy.   Brother deceased from MI in his 1550s. Has done EKG and stress test about 8 years ago.  PCP:  Shaune Pollackonna Gates, MD  Patient's last menstrual period was 06/07/2012.          Sexually active: No.female partner  The current method of family planning is post menopausal status.    Exercising: No.  none. Smoker:  no  Health Maintenance: Pap:  08/2012 wnl:neg HR HPV History of abnormal Pap:  Yes, may have had one remote abnormal pap.  No treatments to cervix. MMG:  11-17-13 density almost entirely fatty/nl:[facility] Colonoscopy:  07/2010 normal with Dr. Matthias HughsBuccini. Past due for repeat and patient knows to call and schedule. BMD:   06/2010  Result  Now normal--took Boniva in past/chose to stop. TDaP:  03-29-08 Screening Labs:  Hb today: PCP, Urine today: Neg   reports that she has never smoked. She does not have any smokeless tobacco history on file. She reports that she drinks about 0.6 oz of alcohol per week. She reports that she does not use illicit drugs.  Past Medical History  Diagnosis Date  . Osteopenia 2005  . GERD (gastroesophageal reflux disease)   . Hypothyroidism 2006    Past Surgical History  Procedure Laterality Date  . Ovarian cyst removal  1982    benign  . Colonoscopy  07/2010    normal   . Thyroidectomy  2007    Current Outpatient Prescriptions  Medication Sig Dispense Refill  . COD LIVER OIL PO Take 1 tablet by mouth daily.     Marland Kitchen. dexlansoprazole (DEXILANT) 60 MG capsule Take 60 mg by mouth daily.    Marland Kitchen. ezetimibe-simvastatin (VYTORIN) 10-40 MG per tablet Take 1 tablet by mouth daily.    Marland Kitchen. levocetirizine (XYZAL) 5 MG tablet Take 5 mg by mouth every evening.    Marland Kitchen. levothyroxine (SYNTHROID) 100  MCG tablet Take 100 mcg by mouth daily.    . Misc Natural Products (OSTEO BI-FLEX ADV DOUBLE ST PO) Take by mouth daily.    . Multiple Vitamins-Minerals (MULTIVITAMIN PO) Take by mouth daily.    . ondansetron (ZOFRAN) 4 MG tablet Take 1 tablet (4 mg total) by mouth every 6 (six) hours. 12 tablet 0  . Vitamin D, Ergocalciferol, (DRISDOL) 50000 UNITS CAPS capsule Take 50,000 Units by mouth every 7 (seven) days.     No current facility-administered medications for this visit.    Family History  Problem Relation Age of Onset  . Heart failure Mother   . Melanoma Father   . Osteoarthritis Father   . Heart attack Brother   . Esophageal cancer Maternal Aunt   . Breast cancer Maternal Grandmother   . Kidney disease Maternal Grandfather   . Colon cancer Paternal Grandfather     ROS:  Pertinent items are noted in HPI.  Otherwise, a comprehensive ROS was negative.  Exam:   BP 120/70 mmHg  Pulse 84  Resp 18  Ht 5' 1.25" (1.556 m)  Wt 197 lb 12.8 oz (89.721 kg)  BMI 37.06 kg/m2  LMP 06/07/2012    General appearance: alert, cooperative and appears stated age Head: Normocephalic, without obvious abnormality, atraumatic  Neck: no adenopathy, supple, symmetrical, trachea midline and thyroid normal to inspection and palpation Lungs: clear to auscultation bilaterally Breasts: normal appearance, no masses or tenderness, Inspection negative, No nipple retraction or dimpling, No nipple discharge or bleeding, No axillary or supraclavicular adenopathy Heart: regular rate and rhythm Abdomen: soft, non-tender; bowel sounds normal; no masses,  no organomegaly Extremities: extremities normal, atraumatic, no cyanosis or edema Skin: Skin color, texture, turgor normal. No rashes or lesions Lymph nodes: Cervical, supraclavicular, and axillary nodes normal. No abnormal inguinal nodes palpated Neurologic: Grossly normal  Pelvic: External genitalia:  no lesions              Urethra:  normal appearing urethra  with no masses, tenderness or lesions              Bartholins and Skenes: normal                 Vagina: normal appearing vagina with normal color and discharge, no lesions              Cervix: no lesions              Pap taken: Yes.   Bimanual Exam:  Uterus:  normal size, contour, position, consistency, mobility, non-tender              Adnexa: normal adnexa and no mass, fullness, tenderness              Rectovaginal: Yes.  .  Confirms.              Anus:  normal sphincter tone, no lesions  Chaperone was present for exam.  Assessment:   Well woman visit with normal exam. FH of early CAD.   Plan: Yearly mammogram recommended after age 69.  Recommended self breast exam.  Pap and HR HPV as above. Discussed Calcium, Vitamin D, regular exercise program including cardiovascular and weight bearing exercise. Labs performed.  No..   See orders. Refills given on medications.  No..    Discussed weight loss and exercise in verbal and written form.  Follow up annually and prn.      After visit summary provided.

## 2014-10-04 NOTE — Patient Instructions (Addendum)
EXERCISE AND DIET:  We recommended that you start or continue a regular exercise program for good health. Regular exercise means any activity that makes your heart beat faster and makes you sweat.  We recommend exercising at least 30 minutes per day at least 3 days a week, preferably 4 or 5.  We also recommend a diet low in fat and sugar.  Inactivity, poor dietary choices and obesity can cause diabetes, heart attack, stroke, and kidney damage, among others.    ALCOHOL AND SMOKING:  Women should limit their alcohol intake to no more than 7 drinks/beers/glasses of wine (combined, not each!) per week. Moderation of alcohol intake to this level decreases your risk of breast cancer and liver damage. And of course, no recreational drugs are part of a healthy lifestyle.  And absolutely no smoking or even second hand smoke. Most people know smoking can cause heart and lung diseases, but did you know it also contributes to weakening of your bones? Aging of your skin?  Yellowing of your teeth and nails?  CALCIUM AND VITAMIN D:  Adequate intake of calcium and Vitamin D are recommended.  The recommendations for exact amounts of these supplements seem to change often, but generally speaking 600 mg of calcium (either carbonate or citrate) and 800 units of Vitamin D per day seems prudent. Certain women may benefit from higher intake of Vitamin D.  If you are among these women, your doctor will have told you during your visit.    PAP SMEARS:  Pap smears, to check for cervical cancer or precancers,  have traditionally been done yearly, although recent scientific advances have shown that most women can have pap smears less often.  However, every woman still should have a physical exam from her gynecologist every year. It will include a breast check, inspection of the vulva and vagina to check for abnormal growths or skin changes, a visual exam of the cervix, and then an exam to evaluate the size and shape of the uterus and  ovaries.  And after 67 years of age, a rectal exam is indicated to check for rectal cancers. We will also provide age appropriate advice regarding health maintenance, like when you should have certain vaccines, screening for sexually transmitted diseases, bone density testing, colonoscopy, mammograms, etc.   MAMMOGRAMS:  All women over 40 years old should have a yearly mammogram. Many facilities now offer a "3D" mammogram, which may cost around $50 extra out of pocket. If possible,  we recommend you accept the option to have the 3D mammogram performed.  It both reduces the number of women who will be called back for extra views which then turn out to be normal, and it is better than the routine mammogram at detecting truly abnormal areas.    COLONOSCOPY:  Colonoscopy to screen for colon cancer is recommended for all women at age 50.  We know, you hate the idea of the prep.  We agree, BUT, having colon cancer and not knowing it is worse!!  Colon cancer so often starts as a polyp that can be seen and removed at colonscopy, which can quite literally save your life!  And if your first colonoscopy is normal and you have no family history of colon cancer, most women don't have to have it again for 10 years.  Once every ten years, you can do something that may end up saving your life, right?  We will be happy to help you get it scheduled when you are ready.    Be sure to check your insurance coverage so you understand how much it will cost.  It may be covered as a preventative service at no cost, but you should check your particular policy.     Exercise to Lose Weight Exercise and a healthy diet may help you lose weight. Your doctor may suggest specific exercises. EXERCISE IDEAS AND TIPS  Choose low-cost things you enjoy doing, such as walking, bicycling, or exercising to workout videos.  Take stairs instead of the elevator.  Walk during your lunch break.  Park your car further away from work or  school.  Go to a gym or an exercise class.  Start with 5 to 10 minutes of exercise each day. Build up to 30 minutes of exercise 4 to 6 days a week.  Wear shoes with good support and comfortable clothes.  Stretch before and after working out.  Work out until you breathe harder and your heart beats faster.  Drink extra water when you exercise.  Do not do so much that you hurt yourself, feel dizzy, or get very short of breath. Exercises that burn about 150 calories:  Running 1  miles in 15 minutes.  Playing volleyball for 45 to 60 minutes.  Washing and waxing a car for 45 to 60 minutes.  Playing touch football for 45 minutes.  Walking 1  miles in 35 minutes.  Pushing a stroller 1  miles in 30 minutes.  Playing basketball for 30 minutes.  Raking leaves for 30 minutes.  Bicycling 5 miles in 30 minutes.  Walking 2 miles in 30 minutes.  Dancing for 30 minutes.  Shoveling snow for 15 minutes.  Swimming laps for 20 minutes.  Walking up stairs for 15 minutes.  Bicycling 4 miles in 15 minutes.  Gardening for 30 to 45 minutes.  Jumping rope for 15 minutes.  Washing windows or floors for 45 to 60 minutes. Document Released: 06/07/2010 Document Revised: 07/28/2011 Document Reviewed: 06/07/2010 [employer] Patient Information 2015 [employer]. This information is not intended to replace advice given to you by your health care provider. Make sure you discuss any questions you have with your health care provider.  Calorie Counting for Weight Loss Calories are energy you get from the things you eat and drink. Your body uses this energy to keep you going throughout the day. The number of calories you eat affects your weight. When you eat more calories than your body needs, your body stores the extra calories as fat. When you eat fewer calories than your body needs, your body burns fat to get the energy it needs. Calorie counting means keeping track of how many calories you  eat and drink each day. If you make sure to eat fewer calories than your body needs, you should lose weight. In order for calorie counting to work, you will need to eat the number of calories that are right for you in a day to lose a healthy amount of weight per week. A healthy amount of weight to lose per week is usually 1-2 lb (0.5-0.9 kg). A dietitian can determine how many calories you need in a day and give you suggestions on how to reach your calorie goal.  WHAT IS MY MY PLAN? My goal is to have __________ calories per day.  If I have this many calories per day, I should lose around __________ pounds per week. WHAT DO I NEED TO KNOW ABOUT CALORIE COUNTING? In order to meet your daily calorie goal, you will need   to:  Find out how many calories are in each food you would like to eat. Try to do this before you eat.  Decide how much of the food you can eat.  Write down what you ate and how many calories it had. Doing this is called keeping a food log. WHERE DO I FIND CALORIE INFORMATION? The number of calories in a food can be found on a Nutrition Facts label. Note that all the information on a label is based on a specific serving of the food. If a food does not have a Nutrition Facts label, try to look up the calories online or ask your dietitian for help. HOW DO I DECIDE HOW MUCH TO EAT? To decide how much of the food you can eat, you will need to consider both the number of calories in one serving and the size of one serving. This information can be found on the Nutrition Facts label. If a food does not have a Nutrition Facts label, look up the information online or ask your dietitian for help. Remember that calories are listed per serving. If you choose to have more than one serving of a food, you will have to multiply the calories per serving by the amount of servings you plan to eat. For example, the label on a package of bread might say that a serving size is 1 slice and that there are 90  calories in a serving. If you eat 1 slice, you will have eaten 90 calories. If you eat 2 slices, you will have eaten 180 calories. HOW DO I KEEP A FOOD LOG? After each meal, record the following information in your food log:  What you ate.  How much of it you ate.  How many calories it had.  Then, add up your calories. Keep your food log near you, such as in a small notebook in your pocket. Another option is to use a mobile app or website. Some programs will calculate calories for you and show you how many calories you have left each time you add an item to the log. WHAT ARE SOME CALORIE COUNTING TIPS?  Use your calories on foods and drinks that will fill you up and not leave you hungry. Some examples of this include foods like nuts and nut butters, vegetables, lean proteins, and high-fiber foods (more than 5 g fiber per serving).  Eat nutritious foods and avoid empty calories. Empty calories are calories you get from foods or beverages that do not have many nutrients, such as candy and soda. It is better to have a nutritious high-calorie food (such as an avocado) than a food with few nutrients (such as a bag of chips).  Know how many calories are in the foods you eat most often. This way, you do not have to look up how many calories they have each time you eat them.  Look out for foods that may seem like low-calorie foods but are really high-calorie foods, such as baked goods, soda, and fat-free candy.  Pay attention to calories in drinks. Drinks such as sodas, specialty coffee drinks, alcohol, and juices have a lot of calories yet do not fill you up. Choose low-calorie drinks like water and diet drinks.  Focus your calorie counting efforts on higher calorie items. Logging the calories in a garden salad that contains only vegetables is less important than calculating the calories in a milk shake.  Find a way of tracking calories that works for you. Get creative. Most   people who are  successful find ways to keep track of how much they eat in a day, even if they do not count every calorie. WHAT ARE SOME PORTION CONTROL TIPS?  Know how many calories are in a serving. This will help you know how many servings of a certain food you can have.  Use a measuring cup to measure serving sizes. This is helpful when you start out. With time, you will be able to estimate serving sizes for some foods.  Take some time to put servings of different foods on your favorite plates, bowls, and cups so you know what a serving looks like.  Try not to eat straight from a bag or box. Doing this can lead to overeating. Put the amount you would like to eat in a cup or on a plate to make sure you are eating the right portion.  Use smaller plates, glasses, and bowls to prevent overeating. This is a quick and easy way to practice portion control. If your plate is smaller, less food can fit on it.  Try not to multitask while eating, such as watching TV or using your computer. If it is time to eat, sit down at a table and enjoy your food. Doing this will help you to start recognizing when you are full. It will also make you more aware of what and how much you are eating. HOW CAN I CALORIE COUNT WHEN EATING OUT?  Ask for smaller portion sizes or child-sized portions.  Consider sharing an entree and sides instead of getting your own entree.  If you get your own entree, eat only half. Ask for a box at the beginning of your meal and put the rest of your entree in it so you are not tempted to eat it.  Look for the calories on the menu. If calories are listed, choose the lower calorie options.  Choose dishes that include vegetables, fruits, whole grains, low-fat dairy products, and lean protein. Focusing on smart food choices from each of the 5 food groups can help you stay on track at restaurants.  Choose items that are boiled, broiled, grilled, or steamed.  Choose water, milk, unsweetened iced tea, or  other drinks without added sugars. If you want an alcoholic beverage, choose a lower calorie option. For example, a regular margarita can have up to 700 calories and a glass of wine has around 150.  Stay away from items that are buttered, battered, fried, or served with cream sauce. Items labeled "crispy" are usually fried, unless stated otherwise.  Ask for dressings, sauces, and syrups on the side. These are usually very high in calories, so do not eat much of them.  Watch out for salads. Many people think salads are a healthy option, but this is often not the case. Many salads come with bacon, fried chicken, lots of cheese, fried chips, and dressing. All of these items have a lot of calories. If you want a salad, choose a garden salad and ask for grilled meats or steak. Ask for the dressing on the side, or ask for olive oil and vinegar or lemon to use as dressing.  Estimate how many servings of a food you are given. For example, a serving of cooked rice is  cup or about the size of half a tennis ball or one cupcake wrapper. Knowing serving sizes will help you be aware of how much food you are eating at restaurants. The list below tells you how big or small   some common portion sizes are based on everyday objects.  1 oz--4 stacked dice.  3 oz--1 deck of cards.  1 tsp--1 dice.  1 Tbsp-- a Ping-Pong ball.  2 Tbsp--1 Ping-Pong ball.   cup--1 tennis ball or 1 cupcake wrapper.  1 cup--1 baseball. Document Released: 05/05/2005 Document Revised: 09/19/2013 Document Reviewed: 03/10/2013 [employer] Patient Information 2015 [employer]. This information is not intended to replace advice given to you by your health care provider. Make sure you discuss any questions you have with your health care provider.  

## 2014-10-05 LAB — IPS PAP SMEAR ONLY

## 2014-11-13 ENCOUNTER — Other Ambulatory Visit: Payer: Self-pay

## 2015-01-11 IMAGING — CR DG ABDOMEN 2V
2 series · 2 of 2 positions shown · non-contrast
Comparison: None.

CLINICAL DATA: Nausea and diarrhea two days.

EXAM:
ABDOMEN - 2 VIEW

[w abdomen upright]
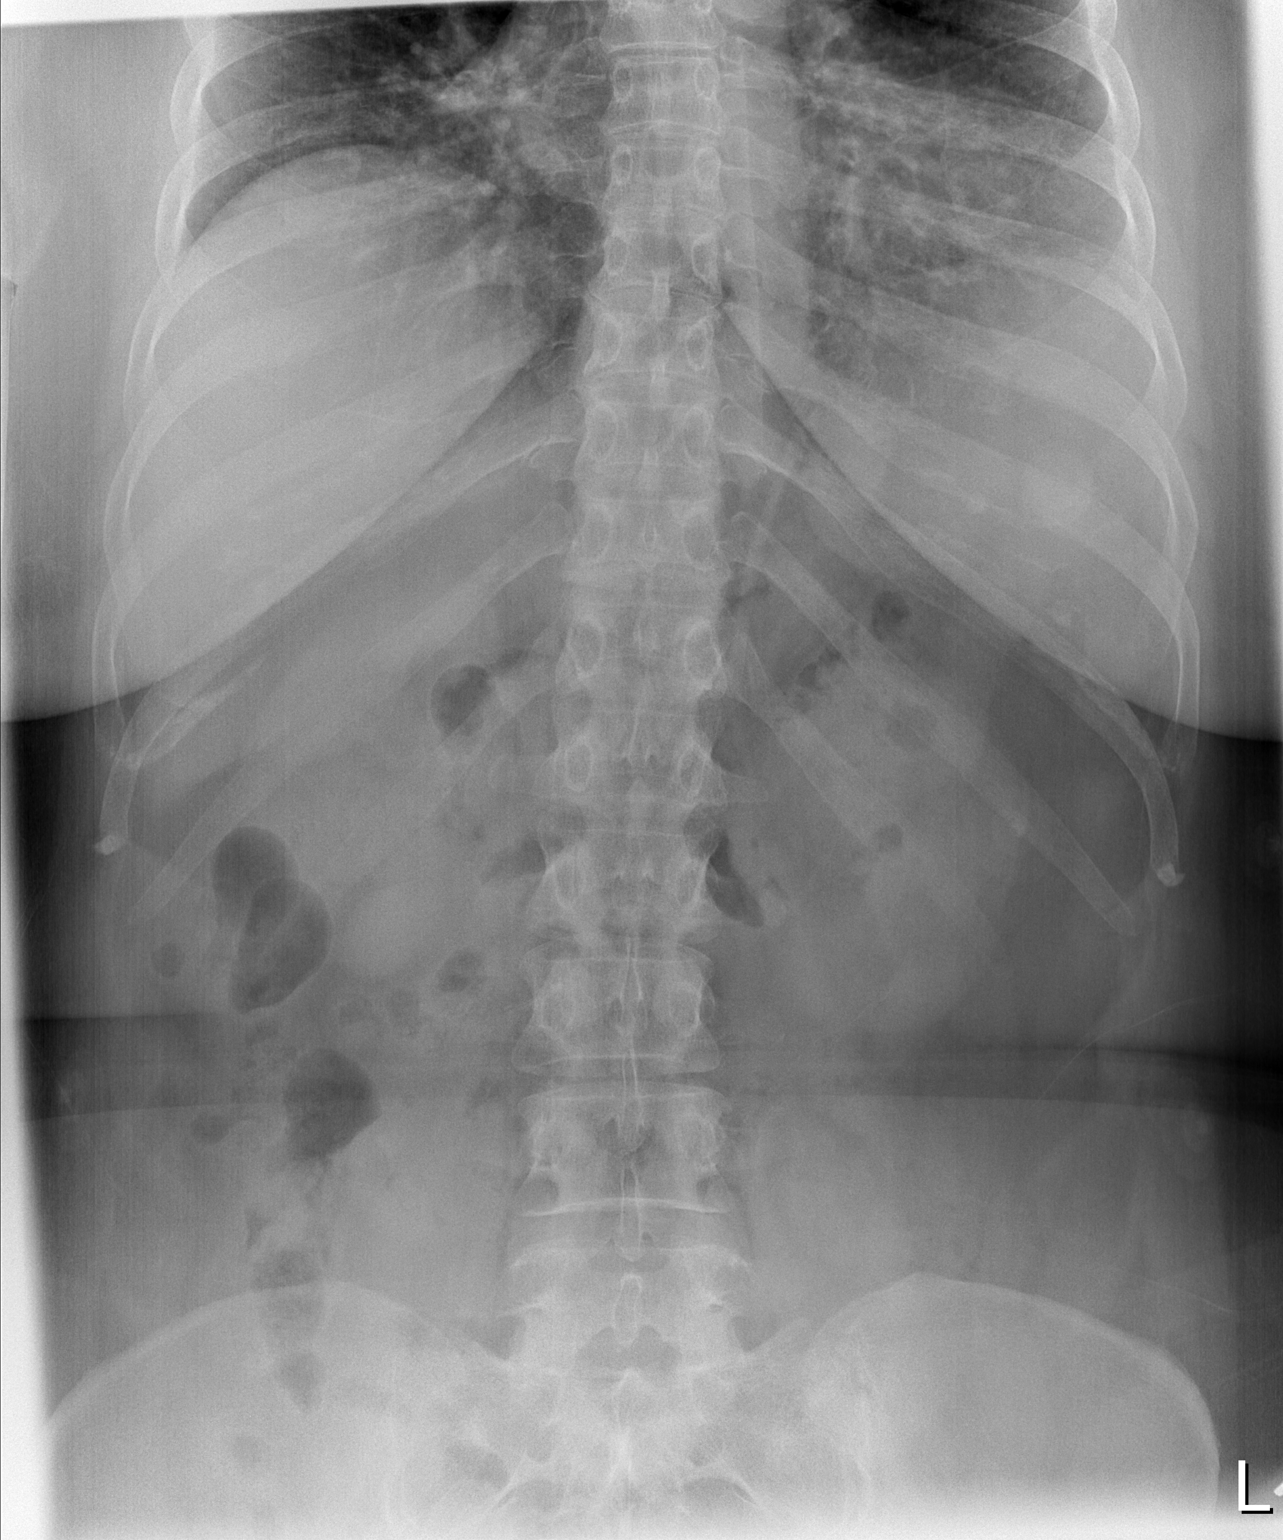

[t abdomen supine]
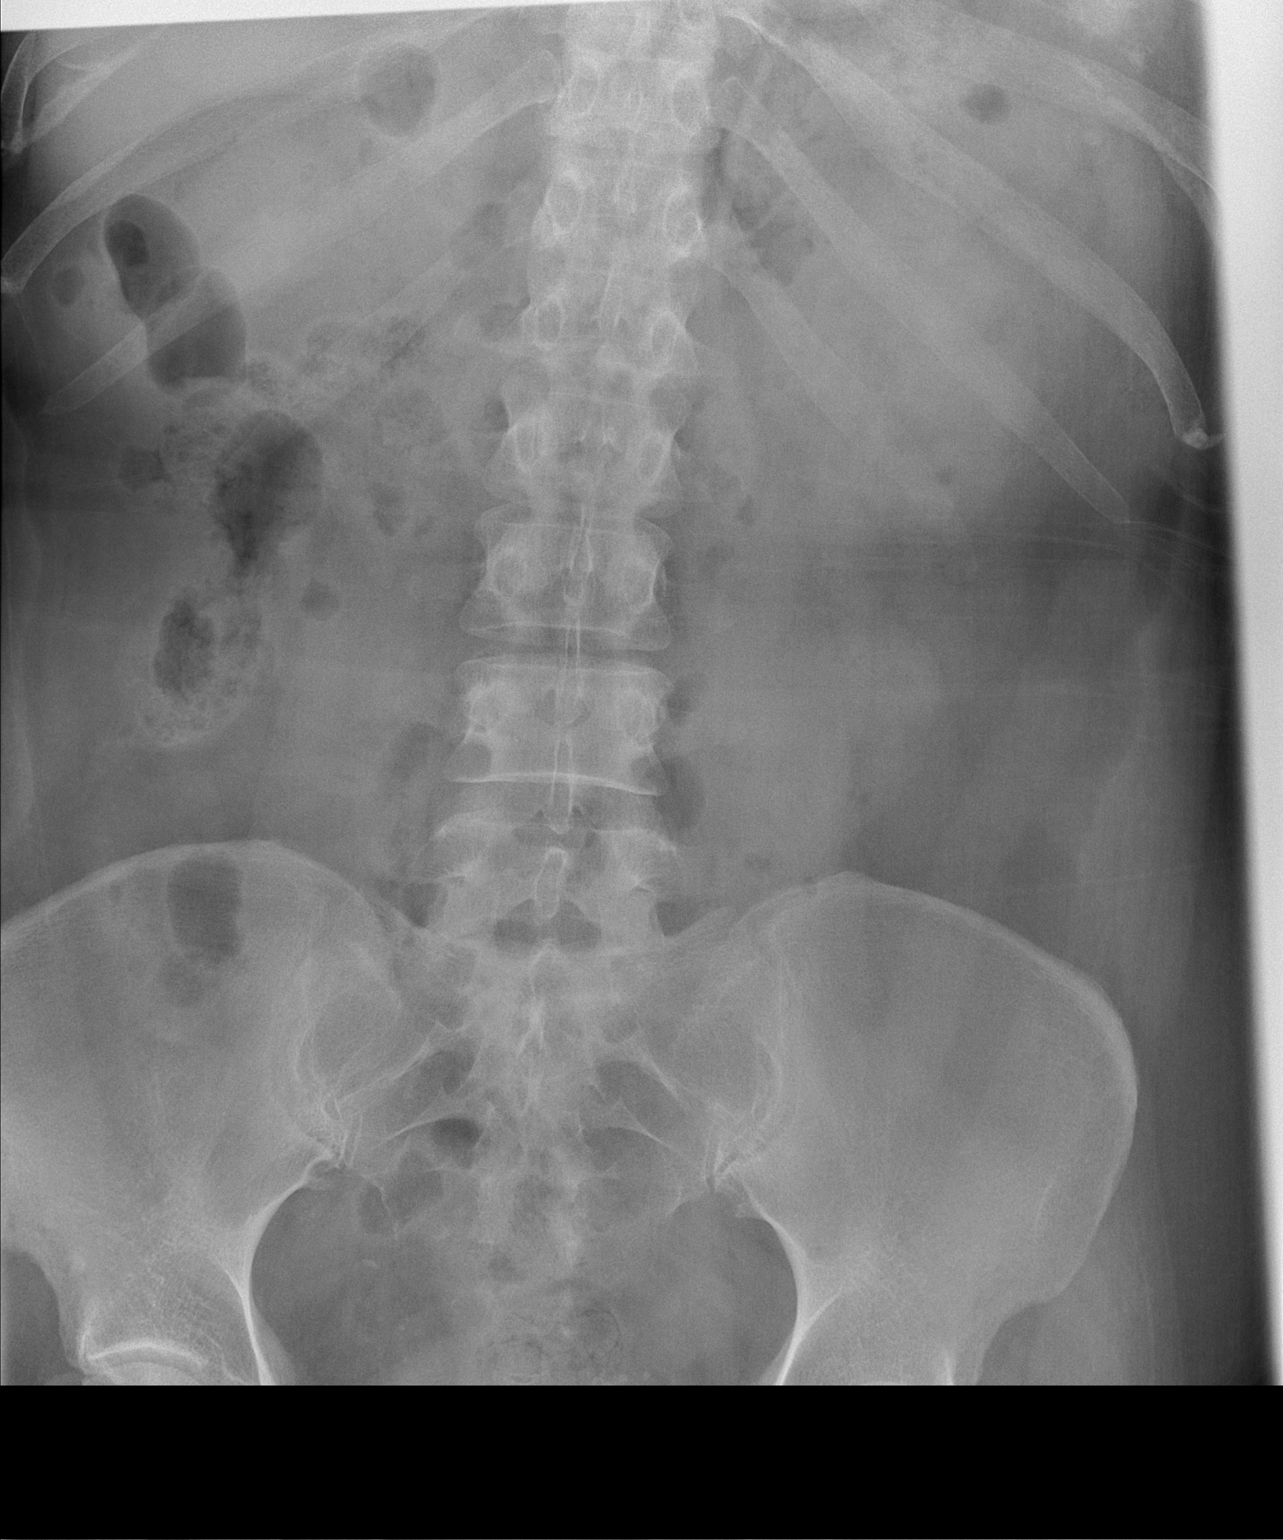

[2 of 2 positions shown; findings below may reference images not displayed]

FINDINGS: Bowel gas pattern is nonobstructive. There is no free peritoneal
air. There are a few dense rounded foci projected over the left
upper quadrant with the largest measuring 2.1 cm of uncertain
clinical significance and may represent benign calcified structure
within the overlying soft tissues versus calcified splenic
granulomas. Mild degenerative change of the spine. Few
calcifications over the pelvis likely phleboliths.
IMPRESSION: Nonobstructive bowel gas pattern.

## 2015-10-10 ENCOUNTER — Ambulatory Visit: Payer: Medicare Other | Admitting: Obstetrics and Gynecology

## 2015-10-17 ENCOUNTER — Encounter: Payer: Self-pay | Admitting: Obstetrics and Gynecology

## 2015-10-17 ENCOUNTER — Ambulatory Visit (INDEPENDENT_AMBULATORY_CARE_PROVIDER_SITE_OTHER): Payer: Medicare Other | Admitting: Obstetrics and Gynecology

## 2015-10-17 VITALS — BP 122/70 | HR 78 | Resp 15 | Ht 61.25 in | Wt 191.0 lb

## 2015-10-17 DIAGNOSIS — E890 Postprocedural hypothyroidism: Secondary | ICD-10-CM | POA: Diagnosis not present

## 2015-10-17 DIAGNOSIS — Z113 Encounter for screening for infections with a predominantly sexual mode of transmission: Secondary | ICD-10-CM

## 2015-10-17 DIAGNOSIS — R7303 Prediabetes: Secondary | ICD-10-CM

## 2015-10-17 DIAGNOSIS — Z0000 Encounter for general adult medical examination without abnormal findings: Secondary | ICD-10-CM | POA: Diagnosis not present

## 2015-10-17 DIAGNOSIS — Z01419 Encounter for gynecological examination (general) (routine) without abnormal findings: Secondary | ICD-10-CM

## 2015-10-17 LAB — POCT URINALYSIS DIPSTICK
Bilirubin, UA: NEGATIVE
GLUCOSE UA: NEGATIVE
KETONES UA: NEGATIVE
Leukocytes, UA: NEGATIVE
Nitrite, UA: NEGATIVE
Protein, UA: NEGATIVE
RBC UA: NEGATIVE
UROBILINOGEN UA: NEGATIVE
pH, UA: 6

## 2015-10-17 LAB — THYROID PANEL WITH TSH
FREE THYROXINE INDEX: 3.3 (ref 1.4–3.8)
T3 UPTAKE: 36 % — AB (ref 22–35)
T4 TOTAL: 9.2 ug/dL (ref 4.5–12.0)
TSH: 0.43 m[IU]/L

## 2015-10-17 LAB — HEMOGLOBIN A1C
Hgb A1c MFr Bld: 5.9 % — ABNORMAL HIGH (ref <5.7)
Mean Plasma Glucose: 123 mg/dL

## 2015-10-17 NOTE — Progress Notes (Signed)
Patient ID: Heidi Frank, female   DOB: 11/02/1945, 70 y.o.   MRN: [id number] 70 y.o. G0P0 Single Caucasian female here for annual exam.    Having diarrhea and blood in stool.  Scheduled for colonoscopy next week.  Dx. Colitis.   Would like testing for hepatitis C and HIV. Wants TSH and hemoglobin A1C checked today.   Will see Dr. Horald PollenBalen in December.  Will see Dr. Kevan NyGates in August.   Living closer to her parents.  Mother just discharged from the hospital.  Father is elderly as well.   PCP: Shaune Pollackonna Gates, MD  Patient's last menstrual period was 06/07/2012.           Sexually active: No.  The current method of family planning is post menopausal status.    Exercising: No.  The patient does not participate in regular exercise at present. Smoker:  no  Health Maintenance: Pap:  10-04-14 WNL History of abnormal Pap: Yes, may have had one remote abnormal pap. No treatments to cervix. MMG:  11-17-13 WNL BI RADS 1 - [facility].  &/8/16 - [facility] - BI-RADS1.  Density rating A. Colonoscopy:  07/2010 WNL with Dr.Buccini - scheduled for 10/25/15  BMD:   06/2010   Result  Now normal  TDaP:  03-29-08 Gardasil:   no   Screening Labs:    Urine today: WNL   reports that she has never smoked. She has never used smokeless tobacco. She reports that she drinks about 0.6 oz of alcohol per week. She reports that she does not use illicit drugs.  Past Medical History  Diagnosis Date  . Osteopenia 2005  . GERD (gastroesophageal reflux disease)   . Hypothyroidism 2006   Past Surgical History  Procedure Laterality Date  . Ovarian cyst removal  1982    benign  . Colonoscopy  07/2010    normal   . Thyroidectomy  2007    Current Outpatient Prescriptions  Medication Sig Dispense Refill  . COD LIVER OIL PO Take 1 tablet by mouth daily.     Marland Kitchen. dexlansoprazole (DEXILANT) 60 MG capsule Take 60 mg by mouth daily.    Marland Kitchen. ezetimibe-simvastatin (VYTORIN) 10-40 MG per tablet Take 1 tablet by mouth daily.    Marland Kitchen.  levocetirizine (XYZAL) 5 MG tablet Take 5 mg by mouth every evening.    Marland Kitchen. levothyroxine (SYNTHROID) 100 MCG tablet Take 100 mcg by mouth daily.    . Misc Natural Products (OSTEO BI-FLEX ADV DOUBLE ST PO) Take by mouth daily.    . Multiple Vitamins-Minerals (MULTIVITAMIN PO) Take by mouth daily.    . ondansetron (ZOFRAN) 4 MG tablet Take 1 tablet (4 mg total) by mouth every 6 (six) hours. 12 tablet 0  . Probiotic Product (PROBIOTIC ADVANCED PO) Take by mouth.    . Vitamin D, Ergocalciferol, (DRISDOL) 50000 UNITS CAPS capsule Take 50,000 Units by mouth every 7 (seven) days.     No current facility-administered medications for this visit.    Family History  Problem Relation Age of Onset  . Heart failure Mother   . Melanoma Father   . Osteoarthritis Father   . Heart attack Brother   . Esophageal cancer Maternal Aunt   . Breast cancer Maternal Grandmother   . Kidney disease Maternal Grandfather   . Colon cancer Paternal Grandfather     ROS:  Pertinent items are noted in HPI.  Otherwise, a comprehensive ROS was negative.  Exam:   BP 122/70 mmHg  Pulse 78  Resp 15  Ht 5' 1.25" (1.556 m)  Wt 191 lb (86.637 kg)  BMI 35.78 kg/m2  LMP 06/07/2012    General appearance: alert, cooperative and appears stated age Head: Normocephalic, without obvious abnormality, atraumatic Neck: no adenopathy, supple, symmetrical, trachea midline and thyroid normal to inspection and palpation Lungs: clear to auscultation bilaterally Breasts: normal appearance, no masses or tenderness, Inspection negative, No nipple retraction or dimpling, No nipple discharge or bleeding, No axillary or supraclavicular adenopathy Heart: regular rate and rhythm Abdomen: incisions:  No.    , soft, non-tender; no masses, no organomegaly Extremities: extremities normal, atraumatic, no cyanosis or edema Skin: Skin color, texture, turgor normal. No rashes or lesions Lymph nodes: Cervical, supraclavicular, and axillary nodes  normal. No abnormal inguinal nodes palpated Neurologic: Grossly normal  Pelvic: External genitalia:  no lesions              Urethra:  normal appearing urethra with no masses, tenderness or lesions              Bartholins and Skenes: normal                 Vagina: normal appearing vagina with normal color and discharge, no lesions              Cervix: no lesions              Pap taken: No. Bimanual Exam:  Uterus:  normal size, contour, position, consistency, mobility, non-tender              Adnexa: normal adnexa and no mass, fullness, tenderness              Rectal exam: Yes.  .  Confirms.              Anus:  normal sphincter tone, no lesions  Chaperone was present for exam.  Assessment:   Well woman visit with normal exam. Hypothyroidism.  FH CAD. Hx prediabetes.  Colitis.  STD screening.   Plan: Yearly mammogram recommended after age 26.  Recommended self breast exam.  Pap and HR HPV as above. Guidelines for Calcium, Vitamin D, regular exercise program including cardiovascular and weight bearing exercise. Labs performed.  Yes.  .   See orders.  HIV, Hep C, HgbA1C, TFTs. Prescription medication(s) given.  No..   Follow up annually and prn.      After visit summary provided.

## 2015-10-17 NOTE — Patient Instructions (Signed)
Health Maintenance, Female Adopting a healthy lifestyle and getting preventive care can go a long way to promote health and wellness. Talk with your health care provider about what schedule of regular examinations is right for you. This is a good chance for you to check in with your provider about disease prevention and staying healthy. In between checkups, there are plenty of things you can do on your own. Experts have done a lot of research about which lifestyle changes and preventive measures are most likely to keep you healthy. Ask your health care provider for more information. WEIGHT AND DIET  Eat a healthy diet  Be sure to include plenty of vegetables, fruits, low-fat dairy products, and lean protein.  Do not eat a lot of foods high in solid fats, added sugars, or salt.  Get regular exercise. This is one of the most important things you can do for your health.  Most adults should exercise for at least 150 minutes each week. The exercise should increase your heart rate and make you sweat (moderate-intensity exercise).  Most adults should also do strengthening exercises at least twice a week. This is in addition to the moderate-intensity exercise.  Maintain a healthy weight  Body mass index (BMI) is a measurement that can be used to identify possible weight problems. It estimates body fat based on height and weight. Your health care provider can help determine your BMI and help you achieve or maintain a healthy weight.  For females 20 years of age and older:   A BMI below 18.5 is considered underweight.  A BMI of 18.5 to 24.9 is normal.  A BMI of 25 to 29.9 is considered overweight.  A BMI of 30 and above is considered obese.  Watch levels of cholesterol and blood lipids  You should start having your blood tested for lipids and cholesterol at 68 years of age, then have this test every 5 years.  You may need to have your cholesterol levels checked more often if:  Your lipid  or cholesterol levels are high.  You are older than 68 years of age.  You are at high risk for heart disease.  CANCER SCREENING   Lung Cancer  Lung cancer screening is recommended for adults 55-80 years old who are at high risk for lung cancer because of a history of smoking.  A yearly low-dose CT scan of the lungs is recommended for people who:  Currently smoke.  Have quit within the past 15 years.  Have at least a 30-pack-year history of smoking. A pack year is smoking an average of one pack of cigarettes a day for 1 year.  Yearly screening should continue until it has been 15 years since you quit.  Yearly screening should stop if you develop a health problem that would prevent you from having lung cancer treatment.  Breast Cancer  Practice breast self-awareness. This means understanding how your breasts normally appear and feel.  It also means doing regular breast self-exams. Let your health care provider know about any changes, no matter how small.  If you are in your 20s or 30s, you should have a clinical breast exam (CBE) by a health care provider every 1-3 years as part of a regular health exam.  If you are 40 or older, have a CBE every year. Also consider having a breast X-ray (mammogram) every year.  If you have a family history of breast cancer, talk to your health care provider about genetic screening.  If you   are at high risk for breast cancer, talk to your health care provider about having an MRI and a mammogram every year.  Breast cancer gene (BRCA) assessment is recommended for women who have family members with BRCA-related cancers. BRCA-related cancers include:  Breast.  Ovarian.  Tubal.  Peritoneal cancers.  Results of the assessment will determine the need for genetic counseling and BRCA1 and BRCA2 testing. Cervical Cancer Your health care provider may recommend that you be screened regularly for cancer of the pelvic organs (ovaries, uterus, and  vagina). This screening involves a pelvic examination, including checking for microscopic changes to the surface of your cervix (Pap test). You may be encouraged to have this screening done every 3 years, beginning at age 21.  For women ages 30-65, health care providers may recommend pelvic exams and Pap testing every 3 years, or they may recommend the Pap and pelvic exam, combined with testing for human papilloma virus (HPV), every 5 years. Some types of HPV increase your risk of cervical cancer. Testing for HPV may also be done on women of any age with unclear Pap test results.  Other health care providers may not recommend any screening for nonpregnant women who are considered low risk for pelvic cancer and who do not have symptoms. Ask your health care provider if a screening pelvic exam is right for you.  If you have had past treatment for cervical cancer or a condition that could lead to cancer, you need Pap tests and screening for cancer for at least 20 years after your treatment. If Pap tests have been discontinued, your risk factors (such as having a new sexual partner) need to be reassessed to determine if screening should resume. Some women have medical problems that increase the chance of getting cervical cancer. In these cases, your health care provider may recommend more frequent screening and Pap tests. Colorectal Cancer  This type of cancer can be detected and often prevented.  Routine colorectal cancer screening usually begins at 68 years of age and continues through 68 years of age.  Your health care provider may recommend screening at an earlier age if you have risk factors for colon cancer.  Your health care provider may also recommend using home test kits to check for hidden blood in the stool.  A small camera at the end of a tube can be used to examine your colon directly (sigmoidoscopy or colonoscopy). This is done to check for the earliest forms of colorectal  cancer.  Routine screening usually begins at age 50.  Direct examination of the colon should be repeated every 5-10 years through 68 years of age. However, you may need to be screened more often if early forms of precancerous polyps or small growths are found. Skin Cancer  Check your skin from head to toe regularly.  Tell your health care provider about any new moles or changes in moles, especially if there is a change in a mole's shape or color.  Also tell your health care provider if you have a mole that is larger than the size of a pencil eraser.  Always use sunscreen. Apply sunscreen liberally and repeatedly throughout the day.  Protect yourself by wearing long sleeves, pants, a wide-brimmed hat, and sunglasses whenever you are outside. HEART DISEASE, DIABETES, AND HIGH BLOOD PRESSURE   High blood pressure causes heart disease and increases the risk of stroke. High blood pressure is more likely to develop in:  People who have blood pressure in the high end   of the normal range (130-139/85-89 mm Hg).  People who are overweight or obese.  People who are African American.  If you are 38-23 years of age, have your blood pressure checked every 3-5 years. If you are 61 years of age or older, have your blood pressure checked every year. You should have your blood pressure measured twice--once when you are at a hospital or clinic, and once when you are not at a hospital or clinic. Record the average of the two measurements. To check your blood pressure when you are not at a hospital or clinic, you can use:  An automated blood pressure machine at a pharmacy.  A home blood pressure monitor.  If you are between 45 years and 39 years old, ask your health care provider if you should take aspirin to prevent strokes.  Have regular diabetes screenings. This involves taking a blood sample to check your fasting blood sugar level.  If you are at a normal weight and have a low risk for diabetes,  have this test once every three years after 68 years of age.  If you are overweight and have a high risk for diabetes, consider being tested at a younger age or more often. PREVENTING INFECTION  Hepatitis B  If you have a higher risk for hepatitis B, you should be screened for this virus. You are considered at high risk for hepatitis B if:  You were born in a country where hepatitis B is common. Ask your health care provider which countries are considered high risk.  Your parents were born in a high-risk country, and you have not been immunized against hepatitis B (hepatitis B vaccine).  You have HIV or AIDS.  You use needles to inject street drugs.  You live with someone who has hepatitis B.  You have had sex with someone who has hepatitis B.  You get hemodialysis treatment.  You take certain medicines for conditions, including cancer, organ transplantation, and autoimmune conditions. Hepatitis C  Blood testing is recommended for:  Everyone born from [birth year] through 1963.  Anyone with known risk factors for hepatitis C. Sexually transmitted infections (STIs)  You should be screened for sexually transmitted infections (STIs) including gonorrhea and chlamydia if:  You are sexually active and are younger than 68 years of age.  You are older than 68 years of age and your health care provider tells you that you are at risk for this type of infection.  Your sexual activity has changed since you were last screened and you are at an increased risk for chlamydia or gonorrhea. Ask your health care provider if you are at risk.  If you do not have HIV, but are at risk, it may be recommended that you take a prescription medicine daily to prevent HIV infection. This is called pre-exposure prophylaxis (PrEP). You are considered at risk if:  You are sexually active and do not regularly use condoms or know the HIV status of your partner(s).  You take drugs by injection.  You are sexually  active with a partner who has HIV. Talk with your health care provider about whether you are at high risk of being infected with HIV. If you choose to begin PrEP, you should first be tested for HIV. You should then be tested every 3 months for as long as you are taking PrEP.  PREGNANCY   If you are premenopausal and you may become pregnant, ask your health care provider about preconception counseling.  If you may  become pregnant, take 400 to 800 micrograms (mcg) of folic acid every day.  If you want to prevent pregnancy, talk to your health care provider about birth control (contraception). OSTEOPOROSIS AND MENOPAUSE   Osteoporosis is a disease in which the bones lose minerals and strength with aging. This can result in serious bone fractures. Your risk for osteoporosis can be identified using a bone density scan.  If you are 67 years of age or older, or if you are at risk for osteoporosis and fractures, ask your health care provider if you should be screened.  Ask your health care provider whether you should take a calcium or vitamin D supplement to lower your risk for osteoporosis.  Menopause may have certain physical symptoms and risks.  Hormone replacement therapy may reduce some of these symptoms and risks. Talk to your health care provider about whether hormone replacement therapy is right for you.  HOME CARE INSTRUCTIONS   Schedule regular health, dental, and eye exams.  Stay current with your immunizations.   Do not use any tobacco products including cigarettes, chewing tobacco, or electronic cigarettes.  If you are pregnant, do not drink alcohol.  If you are breastfeeding, limit how much and how often you drink alcohol.  Limit alcohol intake to no more than 1 drink per day for nonpregnant women. One drink equals 12 ounces of beer, 5 ounces of wine, or 1 ounces of hard liquor.  Do not use street drugs.  Do not share needles.  Ask your health care provider for help if  you need support or information about quitting drugs.  Tell your health care provider if you often feel depressed.  Tell your health care provider if you have ever been abused or do not feel safe at home.   This information is not intended to replace advice given to you by your health care provider. Make sure you discuss any questions you have with your health care provider.   Document Released: 11/18/2010 Document Revised: 05/26/2014 Document Reviewed: 04/06/2013 Elsevier Interactive Patient Education 2016 [employer]  Colitis Colitis is inflammation of the colon. Colitis may last a short time (acute) or it may last a long time (chronic). CAUSES This condition may be caused by:  Viruses.  Bacteria.  Reactions to medicine.  Certain autoimmune diseases, such as Crohn disease or ulcerative colitis. SYMPTOMS Symptoms of this condition include:  Diarrhea.  Passing bloody or tarry stool.  Pain.  Fever.  Vomiting.  Tiredness (fatigue).  Weight loss.  Bloating.  Sudden increase in abdominal pain.  Having fewer bowel movements than usual. DIAGNOSIS This condition is diagnosed with a stool test or a blood test. You may also have other tests, including X-rays, a CT scan, or a colonoscopy. TREATMENT Treatment may include:  Resting the bowel. This involves not eating or drinking for a period of time.  Fluids that are given through an IV tube.  Medicine for pain and diarrhea.  Antibiotic medicines.  Cortisone medicines.  Surgery. HOME CARE INSTRUCTIONS Eating and Drinking  Follow instructions from your health care provider about eating or drinking restrictions.  Drink enough fluid to keep your urine clear or pale yellow.  Work with a dietitian to determine which foods cause your condition to flare up.  Avoid foods that cause flare-ups.  Eat a well-balanced diet. Medicines  Take over-the-counter and prescription medicines only as told by your health  care provider.  If you were prescribed an antibiotic medicine, take it as told by your health  care provider. Do not stop taking the antibiotic even if you start to feel better. General Instructions  Keep all follow-up visits as told by your health care provider. This is important. SEEK MEDICAL CARE IF:  Your symptoms do not go away.  You develop new symptoms. SEEK IMMEDIATE MEDICAL CARE IF:  You have a fever that does not go away with treatment.  You develop chills.  You have extreme weakness, fainting, or dehydration.  You have repeated vomiting.  You develop severe pain in your abdomen.  You pass bloody or tarry stool.   This information is not intended to replace advice given to you by your health care provider. Make sure you discuss any questions you have with your health care provider.   Document Released: 06/12/2004 Document Revised: 01/24/2015 Document Reviewed: 08/28/2014 Elsevier Interactive Patient Education [employer].

## 2015-10-18 LAB — HEPATITIS C ANTIBODY: HCV Ab: NEGATIVE

## 2015-10-18 LAB — HIV ANTIBODY (ROUTINE TESTING W REFLEX): HIV 1&2 Ab, 4th Generation: NONREACTIVE

## 2016-10-16 NOTE — Progress Notes (Signed)
71 y.o. G0P0 Single Caucasian female here for annual exam.    No bleeding or spotting.   Having difficulty sleeping.  Maybe has restless legs.   Having colitis issues.  Sees Dr Matthias Hughs.  Not sexually active. Would like STD testing.  Will see her PCP in the near future.   Taking care of her parents who are both in their 90s.   PCP:  Dr. Shaune Pollack  Patient's last menstrual period was 06/07/2012.           Sexually active: No.  The current method of family planning is post menopausal status.    Exercising: No.  The patient does not participate in regular exercise at present. Smoker:  no  Health Maintenance: Pap:  10-04-14 WNL  08/2012 wnl:neg HR HPV History of abnormal Pap:   Yes, may have had one remote abnormal pap. No treatments to cervix MMG:  11/24/14 BIRADS 1 negative -- per patient had one done in 2017 @[facility]  -- will call for results.  Due again in July 2018.  Colonoscopy: 10/25/15 -- normal w/ Dr. Vincent Peyer  BMD:   06/2010  Result  Now normal--took Boniva in past.    TDaP:  2009 Gardasil:   no HIV: 10/17/15 Negative Hep C: 10/17/15 Negative Screening Labs:  Hb today: PCP takes care of labs -- discuss today   reports that she has never smoked. She has never used smokeless tobacco. She reports that she drinks about 0.6 oz of alcohol per week . She reports that she does not use drugs.  Past Medical History:  Diagnosis Date  . Colitis   . GERD (gastroesophageal reflux disease)   . Hypothyroidism 2006  . Osteopenia 2005    Past Surgical History:  Procedure Laterality Date  . colonoscopy  07/2010   normal   . OVARIAN CYST REMOVAL  1982   benign  . THYROIDECTOMY  2007    Current Outpatient Prescriptions  Medication Sig Dispense Refill  . COD LIVER OIL PO Take 1 tablet by mouth daily.     Marland Kitchen dexlansoprazole (DEXILANT) 60 MG capsule Take 60 mg by mouth daily.    Marland Kitchen ezetimibe-simvastatin (VYTORIN) 10-40 MG per tablet Take 1 tablet by mouth daily.    Marland Kitchen levocetirizine  (XYZAL) 5 MG tablet Take 5 mg by mouth every evening.    Marland Kitchen levothyroxine (SYNTHROID) 112 MCG tablet Take 112 mcg by mouth daily.     . Misc Natural Products (OSTEO BI-FLEX ADV DOUBLE ST PO) Take by mouth daily.    . Multiple Vitamins-Minerals (MULTIVITAMIN PO) Take by mouth daily.    . ondansetron (ZOFRAN) 4 MG tablet Take 1 tablet (4 mg total) by mouth every 6 (six) hours. 12 tablet 0  . Probiotic Product (PROBIOTIC ADVANCED PO) Take by mouth.    . Vitamin D, Ergocalciferol, (DRISDOL) 50000 UNITS CAPS capsule Take 50,000 Units by mouth every 7 (seven) days.     No current facility-administered medications for this visit.     Family History  Problem Relation Age of Onset  . Heart failure Mother   . Melanoma Father   . Osteoarthritis Father   . Heart attack Brother   . Esophageal cancer Maternal Aunt   . Breast cancer Maternal Grandmother   . Kidney disease Maternal Grandfather   . Colon cancer Paternal Grandfather     ROS:  Pertinent items are noted in HPI.  Otherwise, a comprehensive ROS was negative.  Exam:   BP 116/70 (BP Location: Right Arm, Patient Position:  Sitting, Cuff Size: Normal)   Pulse 80   Resp 16   Ht 5\' 1"  (1.549 m)   Wt 195 lb (88.5 kg)   LMP 06/07/2012   BMI 36.84 kg/m     General appearance: alert, cooperative and appears stated age Head: Normocephalic, without obvious abnormality, atraumatic Neck: no adenopathy, supple, symmetrical, trachea midline and thyroid normal to inspection and palpation Lungs: clear to auscultation bilaterally Breasts: normal appearance, no masses or tenderness, No nipple retraction or dimpling, No nipple discharge or bleeding, No axillary or supraclavicular adenopathy Heart: regular rate and rhythm Abdomen: soft, non-tender; no masses, no organomegaly Extremities: extremities normal, atraumatic, no cyanosis or edema Skin: Skin color, texture, turgor normal. No rashes or lesions Lymph nodes: Cervical, supraclavicular, and  axillary nodes normal. No abnormal inguinal nodes palpated Neurologic: Grossly normal  Pelvic: External genitalia:  no lesions              Urethra:  normal appearing urethra with no masses, tenderness or lesions              Bartholins and Skenes: normal                 Vagina: normal appearing vagina with normal color and discharge, no lesions              Cervix: no lesions              Pap taken: Yes.   Bimanual Exam:  Uterus:  normal size, contour, position, consistency, mobility, non-tender              Adnexa: no mass, fullness, tenderness              Rectal exam: Yes.  .  Confirms.              Anus:  normal sphincter tone, no lesions  Chaperone was present for exam.  Assessment:   Well woman visit with normal exam. Hx prior osteopenia. Colitis.   Plan: Mammogram screening discussed. Recommended self breast awareness. Pap and HR HPV as above.  Added GC/CT and trichomonas to pap. Guidelines for Calcium, Vitamin D, regular exercise program including cardiovascular and weight bearing exercise. [facility]  Will get last mammogram report also.  Serum STD testing with Dr. Kevan NyGates when she does her routine labs.  Our lab is closed today. Follow up annually and prn.   After visit summary provided.

## 2016-10-17 ENCOUNTER — Ambulatory Visit (INDEPENDENT_AMBULATORY_CARE_PROVIDER_SITE_OTHER): Payer: Medicare Other | Admitting: Obstetrics and Gynecology

## 2016-10-17 ENCOUNTER — Other Ambulatory Visit (HOSPITAL_COMMUNITY)
Admission: RE | Admit: 2016-10-17 | Discharge: 2016-10-17 | Disposition: A | Discharge status: Home / Self Care | Payer: Medicare Other | Source: Ambulatory Visit | Attending: Obstetrics and Gynecology | Admitting: Obstetrics and Gynecology

## 2016-10-17 ENCOUNTER — Encounter: Payer: Self-pay | Admitting: Obstetrics and Gynecology

## 2016-10-17 VITALS — BP 116/70 | HR 80 | Resp 16 | Ht 61.0 in | Wt 195.0 lb

## 2016-10-17 DIAGNOSIS — Z01419 Encounter for gynecological examination (general) (routine) without abnormal findings: Secondary | ICD-10-CM

## 2016-10-17 DIAGNOSIS — Z113 Encounter for screening for infections with a predominantly sexual mode of transmission: Secondary | ICD-10-CM | POA: Insufficient documentation

## 2016-10-17 NOTE — Patient Instructions (Signed)

## 2016-10-20 LAB — CYTOLOGY - PAP
CHLAMYDIA, DNA PROBE: NEGATIVE
Diagnosis: NEGATIVE
Neisseria Gonorrhea: NEGATIVE
Trichomonas: NEGATIVE

## 2016-12-30 ENCOUNTER — Telehealth: Payer: Self-pay | Admitting: *Deleted

## 2016-12-30 NOTE — Telephone Encounter (Signed)
Patient returned call. Results reviewed with patient and she verbalized understanding.

## 2016-12-30 NOTE — Telephone Encounter (Signed)
Message left per DPR on cell number: [phone] of normal BMD results per Dr. Edward JollySilva. See scanned report. RN advised patient to return call if she had any questions.  Will close encounter.

## 2017-02-10 ENCOUNTER — Encounter: Payer: Self-pay | Admitting: Obstetrics and Gynecology

## 2017-10-21 ENCOUNTER — Ambulatory Visit (INDEPENDENT_AMBULATORY_CARE_PROVIDER_SITE_OTHER): Payer: Medicare Other | Admitting: Obstetrics and Gynecology

## 2017-10-21 ENCOUNTER — Encounter: Payer: Self-pay | Admitting: Obstetrics and Gynecology

## 2017-10-21 ENCOUNTER — Other Ambulatory Visit: Payer: Self-pay

## 2017-10-21 VITALS — BP 124/76 | HR 72 | Resp 16 | Ht 61.25 in | Wt 201.0 lb

## 2017-10-21 DIAGNOSIS — Z01419 Encounter for gynecological examination (general) (routine) without abnormal findings: Secondary | ICD-10-CM

## 2017-10-21 DIAGNOSIS — Z23 Encounter for immunization: Secondary | ICD-10-CM

## 2017-10-21 DIAGNOSIS — K625 Hemorrhage of anus and rectum: Secondary | ICD-10-CM | POA: Diagnosis not present

## 2017-10-21 NOTE — Progress Notes (Signed)
72 y.o. G0P0 Single Caucasian female here for annual exam.    Occasionally has rectal bleeding.   Parents are 91 and 94.  They are doing well.  Patient is providing a lot of care for them.  PCP: Dr. Shaune Pollackonna Gates  Endocriniology:  Dr. Talmage NapBalan.   Patient's last menstrual period was 05/19/1998 (within years).           Sexually active: No.  The current method of family planning is post menopausal status.    Exercising: No.  The patient does not participate in regular exercise at present. Smoker:  no  Health Maintenance: Pap:  10/17/16 Pap smear Negative History of abnormal Pap:  Yes, may have had one remote abnormal pap. No treatments to cervix MMG:  12/15/16 - Bi_RADS 1, 3D.  Colonoscopy:  10/25/15 -- normal w/ Dr. Vincent PeyerBuchini  BMD:   12/15/16  Result  Normal TDaP:  05/30/07 Gardasil:   n/a HIV and Hep C: 10/17/15 negative Screening Labs:  PCP   reports that she has never smoked. She has never used smokeless tobacco. She reports that she drinks about 0.6 oz of alcohol per week. She reports that she does not use drugs.  Past Medical History:  Diagnosis Date  . Colitis   . Elevated cholesterol   . GERD (gastroesophageal reflux disease)   . Hypothyroidism 2006  . Osteopenia 2005    Past Surgical History:  Procedure Laterality Date  . colonoscopy  07/2010   normal   . OVARIAN CYST REMOVAL  1982   benign  . THYROIDECTOMY  2007    Current Outpatient Medications  Medication Sig Dispense Refill  . COD LIVER OIL PO Take 1 tablet by mouth daily.     Marland Kitchen. dexlansoprazole (DEXILANT) 60 MG capsule Take 60 mg by mouth daily.    Marland Kitchen. ezetimibe-simvastatin (VYTORIN) 10-40 MG per tablet Take 1 tablet by mouth daily.    Marland Kitchen. levocetirizine (XYZAL) 5 MG tablet Take 5 mg by mouth every evening.    Marland Kitchen. levothyroxine (SYNTHROID) 112 MCG tablet Take 112 mcg by mouth daily.     . Misc Natural Products (OSTEO BI-FLEX ADV DOUBLE ST PO) Take by mouth daily.    . Multiple Vitamins-Minerals (MULTIVITAMIN PO) Take  by mouth daily.    . ondansetron (ZOFRAN) 4 MG tablet Take 1 tablet (4 mg total) by mouth every 6 (six) hours. 12 tablet 0  . Probiotic Product (PROBIOTIC ADVANCED PO) Take by mouth.    . Vitamin D, Ergocalciferol, (DRISDOL) 50000 UNITS CAPS capsule Take 50,000 Units by mouth every 7 (seven) days.     No current facility-administered medications for this visit.     Family History  Problem Relation Age of Onset  . Heart failure Mother   . Melanoma Father   . Osteoarthritis Father   . Heart attack Brother   . Esophageal cancer Maternal Aunt   . Breast cancer Maternal Grandmother   . Kidney disease Maternal Grandfather   . Colon cancer Paternal Grandfather     Review of Systems  Constitutional: Positive for unexpected weight change.  HENT: Negative.   Eyes: Negative.   Respiratory: Negative.   Cardiovascular: Negative.   Gastrointestinal: Negative.   Endocrine: Negative.   Genitourinary: Negative.   Musculoskeletal: Negative.   Skin: Negative.   Allergic/Immunologic: Negative.   Neurological: Negative.   Hematological: Negative.   Psychiatric/Behavioral: Negative.   reports weight gain.   Exam:   BP 124/76 (BP Location: Right Arm, Patient Position: Sitting, Cuff Size: Large)  Pulse 72   Resp 16   Ht 5' 1.25" (1.556 m)   Wt 201 lb (91.2 kg)   LMP 05/19/1998 (Within Years)   BMI 37.67 kg/m     General appearance: alert, cooperative and appears stated age Head: Normocephalic, without obvious abnormality, atraumatic Neck: no adenopathy, supple, symmetrical, trachea midline and thyroid normal to inspection and palpation Lungs: clear to auscultation bilaterally Breasts: normal appearance, no masses or tenderness, No nipple retraction or dimpling, No nipple discharge or bleeding, No axillary or supraclavicular adenopathy Heart: regular rate and rhythm Abdomen: soft, non-tender; no masses, no organomegaly Extremities: extremities normal, atraumatic, no cyanosis or  edema Skin: Skin color, texture, turgor normal. No rashes or lesions Lymph nodes: Cervical, supraclavicular, and axillary nodes normal. No abnormal inguinal nodes palpated Neurologic: Grossly normal  Pelvic: External genitalia:  no lesions              Urethra:  normal appearing urethra with no masses, tenderness or lesions              Bartholins and Skenes: normal                 Vagina: normal appearing vagina with normal color and discharge, no lesions              Cervix: no lesions              Pap taken: No. Bimanual Exam:  Uterus:  normal size, contour, position, consistency, mobility, non-tender              Adnexa: no mass, fullness, tenderness              Rectal exam: Yes.  .  Confirms.              Anus:  normal sphincter tone, ? Hemorrhoid at about 4:00 inside anus - palpable and not visible.  Chaperone was present for exam.  Assessment:   Well woman visit with normal exam. Rectal bleeding.  Hx colitis.  Caregiver status.   Plan: Mammogram screening. Recommended self breast awareness. Pap and HR HPV as above. Guidelines for Calcium, Vitamin D, regular exercise program including cardiovascular and weight bearing exercise. TDap today.  We will schedule an appointment with Dr. Matthias Hughs.   Labs with PCP.  We talked about increasing support for her parents to give her respite. Follow up annually and prn.  After visit summary provided.

## 2017-10-21 NOTE — Progress Notes (Signed)
Patient scheduled while in office with [facility]/ Dr. Matthias HughsBuccini on 10/22/17 at 8:30am for evaluation of rectal bleeding. Patient verbalizes understanding and is agreeable. OV notes faxed to [phone].

## 2017-10-21 NOTE — Patient Instructions (Signed)

## 2017-11-02 ENCOUNTER — Encounter: Payer: Self-pay | Admitting: Obstetrics and Gynecology

## 2018-02-18 ENCOUNTER — Encounter: Payer: Self-pay | Admitting: Obstetrics and Gynecology

## 2018-11-01 ENCOUNTER — Ambulatory Visit: Payer: Medicare Other | Admitting: Obstetrics and Gynecology

## 2018-12-17 NOTE — Progress Notes (Signed)
73 y.o. G0P0 Single Caucasian female here for annual exam.    Caring for her elderly parents in their 6990s.  [place] in [place]   PCP:   Shaune Pollackonna Gates, MD  Endocrinology:  Dr. Talmage NapBalan.   Patient's last menstrual period was 06/07/2012.           Sexually active: No.  The current method of family planning is post menopausal status.    Exercising: No.  The patient does not participate in regular exercise at present. Smoker:  no  Health Maintenance: Pap: 10-17-16 Negative         10-04-14 negative  History of abnormal Pap:  yes MMG:12-15-16 Neg/density B/BiRads1-- possibly in 2019 Colonoscopy:  10-25-15 normal with Dr. Vincent PeyerBuchini  BMD:   2018  Result  Normal  TDaP:  10-21-17 Gardasil:   n/a HIV: 10-17-15 negative Hep C: 10-17-15 negative  Screening Labs:  Hb today: discuss with provider, Urine today: not collected   reports that she has never smoked. She has never used smokeless tobacco. She reports current alcohol use of about 1.0 standard drinks of alcohol per week. She reports that she does not use drugs.  Past Medical History:  Diagnosis Date  . Colitis   . Elevated cholesterol   . GERD (gastroesophageal reflux disease)   . Hypothyroidism 2006  . Osteopenia 2005    Past Surgical History:  Procedure Laterality Date  . colonoscopy  07/2010   normal   . OVARIAN CYST REMOVAL  1982   benign  . THYROIDECTOMY  2007    Current Outpatient Medications  Medication Sig Dispense Refill  . COD LIVER OIL PO Take 1 tablet by mouth daily.     Marland Kitchen. dexlansoprazole (DEXILANT) 60 MG capsule Take 60 mg by mouth daily.    Marland Kitchen. ezetimibe-simvastatin (VYTORIN) 10-40 MG per tablet Take 1 tablet by mouth daily.    Marland Kitchen. levocetirizine (XYZAL) 5 MG tablet Take 5 mg by mouth every evening.    Marland Kitchen. levothyroxine (SYNTHROID) 112 MCG tablet Take 112 mcg by mouth daily.     . Misc Natural Products (OSTEO BI-FLEX ADV DOUBLE ST PO) Take by mouth daily.    . Multiple Vitamins-Minerals (MULTIVITAMIN PO) Take by mouth daily.     . ondansetron (ZOFRAN) 4 MG tablet Take 1 tablet (4 mg total) by mouth every 6 (six) hours. 12 tablet 0  . Probiotic Product (PROBIOTIC ADVANCED PO) Take by mouth.    . Vitamin D, Ergocalciferol, (DRISDOL) 50000 UNITS CAPS capsule Take 50,000 Units by mouth every 7 (seven) days.     No current facility-administered medications for this visit.     Family History  Problem Relation Age of Onset  . Heart failure Mother   . Melanoma Father   . Osteoarthritis Father   . Heart attack Brother   . Esophageal cancer Maternal Aunt   . Breast cancer Maternal Grandmother   . Kidney disease Maternal Grandfather   . Colon cancer Paternal Grandfather     Review of Systems  Constitutional: Negative.   HENT: Negative.   Eyes: Negative.   Respiratory: Negative.   Cardiovascular: Negative.   Gastrointestinal: Negative.   Endocrine: Negative.   Genitourinary: Negative.   Musculoskeletal: Negative.   Skin: Negative.   Allergic/Immunologic: Negative.   Neurological: Negative.   Hematological: Negative.   Psychiatric/Behavioral: Negative.     Exam:   BP (!) 146/82 (BP Location: Right Arm, Patient Position: Sitting, Cuff Size: Normal)   Pulse 92   Temp (!) 97.3 F (  36.3 C) (Skin)   Resp 14   Ht 5' 1.25" (1.556 m)   Wt 198 lb 4 oz (89.9 kg)   LMP 06/07/2012   BMI 37.15 kg/m     General appearance: alert, cooperative and appears stated age Head: normocephalic, without obvious abnormality, atraumatic Neck: no adenopathy, supple, symmetrical, trachea midline and thyroid normal to inspection and palpation Lungs: clear to auscultation bilaterally Breasts: normal appearance, no masses or tenderness, No nipple retraction or dimpling, No nipple discharge or bleeding, No axillary adenopathy Heart: regular rate and rhythm Abdomen: soft, non-tender; no masses, no organomegaly Extremities: extremities normal, atraumatic, no cyanosis or edema Skin: skin color, texture, turgor normal. No rashes or  lesions Lymph nodes: cervical, supraclavicular, and axillary nodes normal. Neurologic: grossly normal  Pelvic: External genitalia:  no lesions              No abnormal inguinal nodes palpated.              Urethra:  normal appearing urethra with no masses, tenderness or lesions              Bartholins and Skenes: normal                 Vagina: normal appearing vagina with normal color and discharge, no lesions              Cervix: no lesions              Pap taken: Yes.   Bimanual Exam:  Uterus:  normal size, contour, position, consistency, mobility, non-tender              Adnexa: no mass, fullness, tenderness              Rectal exam: Yes.  .  Confirms.              Anus:  normal sphincter tone, no lesions  Chaperone was present for exam.  Assessment:   Well woman visit with normal exam. Caregiver status.   Plan: Mammogram screening discussed.  She will contact [facility] to schedule this.  Self breast awareness reviewed. Pap and HR HPV as above. Guidelines for Calcium, Vitamin D, regular exercise program including cardiovascular and weight bearing exercise. Labs with her PCP.  Caregiver support and resource sharing done.  Follow up annually and prn.   After visit summary provided.

## 2018-12-20 ENCOUNTER — Other Ambulatory Visit: Payer: Self-pay

## 2018-12-21 ENCOUNTER — Other Ambulatory Visit (HOSPITAL_COMMUNITY)
Admission: RE | Admit: 2018-12-21 | Discharge: 2018-12-21 | Disposition: A | Discharge status: Home / Self Care | Payer: Medicare Other | Source: Ambulatory Visit | Attending: Obstetrics and Gynecology | Admitting: Obstetrics and Gynecology

## 2018-12-21 ENCOUNTER — Encounter: Payer: Self-pay | Admitting: Obstetrics and Gynecology

## 2018-12-21 ENCOUNTER — Ambulatory Visit (INDEPENDENT_AMBULATORY_CARE_PROVIDER_SITE_OTHER): Payer: Medicare Other | Admitting: Obstetrics and Gynecology

## 2018-12-21 VITALS — BP 146/82 | HR 92 | Temp 97.3°F | Resp 14 | Ht 61.25 in | Wt 198.2 lb

## 2018-12-21 DIAGNOSIS — Z01419 Encounter for gynecological examination (general) (routine) without abnormal findings: Secondary | ICD-10-CM | POA: Diagnosis not present

## 2018-12-21 NOTE — Patient Instructions (Signed)

## 2018-12-23 LAB — CYTOLOGY - PAP: Diagnosis: NEGATIVE

## 2019-06-01 ENCOUNTER — Encounter: Payer: Self-pay | Admitting: Obstetrics and Gynecology

## 2019-12-23 NOTE — Progress Notes (Signed)
73 y.o. G0P0 Single Caucasian female here for annual exam.    Denies vaginal bleeding, discharge, or pain.   Cousin dx with ovarian cancer.   Patient is taking care of 71 year old parents. She and her parents are vaccinated against Covid.  [place].   Lives in [place], [place], [place].  PCP:   Dr. Georga Hacking, [place] - [place], [place] Endocrinology:  Dr. Horald Pollen.  Patient's last menstrual period was 06/07/2012.           Sexually active: No.  The current method of family planning is status post menopausal.    Exercising: No.  The patient does not participate in regular exercise at present. Smoker:  no  Health Maintenance: Pap:  12/21/18  Negative          10/17/16  Negative           10-04-14 Negative History of abnormal Pap:  yes MMG:  06/01/19  Density Category A, Birads Category 1, negative  Colonoscopy:  10/25/15 Normal - Dr Matthias Hughs.  Had a flexible sigmoidoscopy in 2019 and had polyps per patient.  BMD:   12/15/16 Result  Normal  TDaP:  10/21/17 Gardasil:   N/A HIV: 10/17/15  Negative Hep C:  10/17/15  Negative  Screening Labs:  PCP.    reports that she has never smoked. She has never used smokeless tobacco. She reports previous alcohol use. She reports that she does not use drugs.  Past Medical History:  Diagnosis Date  . Colitis   . Elevated cholesterol   . GERD (gastroesophageal reflux disease)   . Hypothyroidism 2006  . Osteopenia 2005    Past Surgical History:  Procedure Laterality Date  . colonoscopy  07/2010   normal   . OVARIAN CYST REMOVAL  1982   benign  . THYROIDECTOMY  2007    Current Outpatient Medications  Medication Sig Dispense Refill  . COD LIVER OIL PO Take 1 tablet by mouth daily.     Marland Kitchen dexlansoprazole (DEXILANT) 60 MG capsule Take 60 mg by mouth daily.    Marland Kitchen ezetimibe-simvastatin (VYTORIN) 10-40 MG per tablet Take 1 tablet by mouth daily.    Marland Kitchen levocetirizine (XYZAL) 5 MG tablet Take 5 mg by mouth every evening.    Marland Kitchen levothyroxine  (SYNTHROID) 112 MCG tablet Take 112 mcg by mouth daily.     . meloxicam (MOBIC) 15 MG tablet     . Misc Natural Products (OSTEO BI-FLEX ADV DOUBLE ST PO) Take by mouth daily.    . Multiple Vitamins-Minerals (MULTIVITAMIN PO) Take by mouth daily.    . ondansetron (ZOFRAN) 4 MG tablet Take 1 tablet (4 mg total) by mouth every 6 (six) hours. 12 tablet 0  . Probiotic Product (PROBIOTIC ADVANCED PO) Take by mouth.    . Vitamin D, Ergocalciferol, (DRISDOL) 50000 UNITS CAPS capsule Take 50,000 Units by mouth every 7 (seven) days.     No current facility-administered medications for this visit.    Family History  Problem Relation Age of Onset  . Heart failure Mother   . Melanoma Father   . Osteoarthritis Father   . Heart attack Brother   . Esophageal cancer Maternal Aunt   . Breast cancer Maternal Grandmother   . Kidney disease Maternal Grandfather   . Colon cancer Paternal Grandfather     Review of Systems  All other systems reviewed and are negative.   Exam:   BP (!) 142/80 (Cuff Size: Large)  Pulse 76   Resp 20   Ht 5\' 1"  (1.549 m)   Wt 195 lb 9.6 oz (88.7 kg)   LMP 06/07/2012   BMI 36.96 kg/m     General appearance: alert, cooperative and appears stated age Head: normocephalic, without obvious abnormality, atraumatic Neck: no adenopathy, supple, symmetrical, trachea midline and thyroid normal to inspection and palpation Lungs: clear to auscultation bilaterally Breasts: normal appearance, no masses or tenderness, No nipple retraction or dimpling, No nipple discharge or bleeding, No axillary adenopathy Heart: regular rate and rhythm Abdomen: soft, non-tender; no masses, no organomegaly Extremities: extremities normal, atraumatic, no cyanosis or edema Skin: skin color, texture, turgor normal. No rashes or lesions Lymph nodes: cervical, supraclavicular, and axillary nodes normal. Neurologic: grossly normal  Pelvic: External genitalia:  no lesions              No abnormal  inguinal nodes palpated.              Urethra:  normal appearing urethra with no masses, tenderness or lesions              Bartholins and Skenes: normal                 Vagina: normal appearing vagina with normal color and discharge, no lesions              Cervix: no lesions              Pap taken: No. Bimanual Exam:  Uterus:  normal size, contour, position, consistency, mobility, non-tender              Adnexa: no mass, fullness, tenderness              Rectal exam: Yes.  .  Confirms.              Anus:  normal sphincter tone, no lesions  Chaperone was present for exam.  Assessment:   Well woman visit with normal exam. Caregiver status.  Colitis.  FH ovarian cancer.   Plan: Mammogram screening discussed. Self breast awareness reviewed. Pap and HR HPV as above. Guidelines for Calcium, Vitamin D, regular exercise program including cardiovascular and weight bearing exercise. Support given for caregiver status.  We discussed signs and symptoms of ovarian cancer.  Follow up annually and prn.   After visit summary provided.

## 2019-12-27 ENCOUNTER — Other Ambulatory Visit: Payer: Self-pay

## 2019-12-27 ENCOUNTER — Encounter: Payer: Self-pay | Admitting: Obstetrics and Gynecology

## 2019-12-27 ENCOUNTER — Ambulatory Visit (INDEPENDENT_AMBULATORY_CARE_PROVIDER_SITE_OTHER): Payer: Medicare PPO | Admitting: Obstetrics and Gynecology

## 2019-12-27 VITALS — BP 142/80 | HR 76 | Resp 20 | Ht 61.0 in | Wt 195.6 lb

## 2019-12-27 DIAGNOSIS — Z01419 Encounter for gynecological examination (general) (routine) without abnormal findings: Secondary | ICD-10-CM

## 2019-12-27 NOTE — Patient Instructions (Signed)

## 2020-12-31 ENCOUNTER — Ambulatory Visit: Payer: Medicare PPO | Admitting: Obstetrics and Gynecology

## 2021-01-01 ENCOUNTER — Ambulatory Visit: Payer: Medicare PPO | Admitting: Obstetrics and Gynecology

## 2021-02-20 ENCOUNTER — Ambulatory Visit (INDEPENDENT_AMBULATORY_CARE_PROVIDER_SITE_OTHER): Payer: Medicare PPO | Admitting: Obstetrics and Gynecology

## 2021-02-20 ENCOUNTER — Other Ambulatory Visit: Payer: Self-pay

## 2021-02-20 ENCOUNTER — Other Ambulatory Visit (HOSPITAL_COMMUNITY)
Admission: RE | Admit: 2021-02-20 | Discharge: 2021-02-20 | Disposition: A | Discharge status: Home / Self Care | Payer: Medicare PPO | Source: Ambulatory Visit | Attending: Obstetrics and Gynecology | Admitting: Obstetrics and Gynecology

## 2021-02-20 ENCOUNTER — Encounter: Payer: Self-pay | Admitting: Obstetrics and Gynecology

## 2021-02-20 VITALS — BP 140/70 | HR 99 | Ht 61.0 in | Wt 198.0 lb

## 2021-02-20 DIAGNOSIS — Z124 Encounter for screening for malignant neoplasm of cervix: Secondary | ICD-10-CM

## 2021-02-20 DIAGNOSIS — Z01419 Encounter for gynecological examination (general) (routine) without abnormal findings: Secondary | ICD-10-CM | POA: Diagnosis not present

## 2021-02-20 NOTE — Progress Notes (Signed)
GYNECOLOGY  VISIT   HPI: 75 y.o.   Single  Caucasian  female   G0P0 with Patient's last menstrual period was 06/07/2012.   here for breast and pelvic exam.   No vaginal bleeding.  Some urinary urgency.  No urinary incontinence.  Bowel function is good.  Hx colitis.  Can have several BMs per day.   Not SA.   Father died in 06-29-22.  Caring for her mother at home.  PCP - Duncan Dull, MD [place], [place]  GYNECOLOGIC HISTORY: Patient's last menstrual period was 06/07/2012. Contraception:  PMP Menopausal hormone therapy:  none Last mammogram: 08/2020 normal per patient:[facility] Last pap smear: 12-21-18 Neg, 10-17-16 Neg, 10-04-14 Neg BMD in 2018 - normal.  She thinks she did this in 2022 at [place].         OB History     Gravida  0   Para      Term      Preterm      AB      Living         SAB      IAB      Ectopic      Multiple      Live Births                 There are no problems to display for this patient.   Past Medical History:  Diagnosis Date   Colitis    Elevated cholesterol    GERD (gastroesophageal reflux disease)    Hypothyroidism 2006   Osteopenia 2005    Past Surgical History:  Procedure Laterality Date   colonoscopy  07/2010   normal    OVARIAN CYST REMOVAL  1982   benign   THYROIDECTOMY  2007    Current Outpatient Medications  Medication Sig Dispense Refill   COD LIVER OIL PO Take 1 tablet by mouth daily.      dexlansoprazole (DEXILANT) 60 MG capsule 1 capsule     ezetimibe-simvastatin (VYTORIN) 10-40 MG tablet 1 tablet     levocetirizine (XYZAL) 5 MG tablet Take 5 mg by mouth every evening.     levothyroxine (SYNTHROID) 112 MCG tablet Take 112 mcg by mouth daily.      Misc Natural Products (OSTEO BI-FLEX ADV DOUBLE ST PO) Take by mouth daily.     Multiple Vitamins-Minerals (MULTIVITAMIN PO) Take by mouth daily.     ondansetron (ZOFRAN) 4 MG tablet Take 1 tablet (4 mg total) by mouth every 6 (six) hours. 12 tablet 0   Probiotic  Product (PROBIOTIC ADVANCED PO) Take by mouth.     Vitamin D, Ergocalciferol, (DRISDOL) 50000 UNITS CAPS capsule Take 50,000 Units by mouth every 7 (seven) days.     No current facility-administered medications for this visit.     ALLERGIES: Codone [hydrocodone]  Family History  Problem Relation Age of Onset   Heart failure Mother    Melanoma Father    Osteoarthritis Father    Heart attack Brother    Esophageal cancer Maternal Aunt    Breast cancer Maternal Grandmother    Kidney disease Maternal Grandfather    Colon cancer Paternal Grandfather     Social History   Socioeconomic History   Marital status: Single    Spouse name: Not on file   Number of children: Not on file   Years of education: Not on file   Highest education level: Not on file  Occupational History   Not on file  Tobacco Use  Smoking status: Never   Smokeless tobacco: Never  Vaping Use   Vaping Use: Never used  Substance and Sexual Activity   Alcohol use: Not Currently   Drug use: No   Sexual activity: Not Currently    Partners: Male    Birth control/protection: Post-menopausal  Other Topics Concern   Not on file  Social History Narrative   Not on file   Social Determinants of Health   Financial Resource Strain: Not on file  Food Insecurity: Not on file  Transportation Needs: Not on file  Physical Activity: Not on file  Stress: Not on file  Social Connections: Not on file  Intimate Partner Violence: Not on file    Review of Systems  All other systems reviewed and are negative.  PHYSICAL EXAMINATION:    BP 140/70   Pulse 99   Ht 5\' 1"  (1.549 m)   Wt 198 lb (89.8 kg)   LMP 06/07/2012   SpO2 97%   BMI 37.41 kg/m     General appearance: alert, cooperative and appears stated age Head: Normocephalic, without obvious abnormality, atraumatic Neck: no adenopathy, supple, symmetrical, trachea midline and thyroid normal to inspection and palpation Lungs: clear to auscultation  bilaterally Breasts: normal appearance, no masses or tenderness, No nipple retraction or dimpling, No nipple discharge or bleeding, No axillary or supraclavicular adenopathy Heart: regular rate and rhythm Abdomen: soft, non-tender, no masses,  no organomegaly Extremities: extremities normal, atraumatic, no cyanosis or edema Skin: Skin color, texture, turgor normal. No rashes or lesions Lymph nodes: Cervical, supraclavicular, and axillary nodes normal. No abnormal inguinal nodes palpated Neurologic: Grossly normal  Pelvic: External genitalia:  no lesions              Urethra:  normal appearing urethra with no masses, tenderness or lesions              Bartholins and Skenes: normal                 Vagina: normal appearing vagina with normal color and discharge, no lesions              Cervix: no lesions.  Pap collected.                Bimanual Exam:  Uterus:  normal size, contour, position, consistency, mobility, non-tender              Adnexa: no mass, fullness, tenderness              Rectal exam: yes.  Confirms.              Anus:  normal sphincter tone, no lesions  Chaperone was present for exam:  yes.  ASSESSMENT  Well woman with GYN exam.  Cervical cancer screening.  Hx colitis.   PLAN  Pap with reflex HR HPV testing.  Will get a copy of mammogram and bone density.  Self breast exam encouraged.  Support given for loss of her father.  FU in 2 years and prn.   An After Visit Summary was printed and given to the patient.

## 2021-02-20 NOTE — Patient Instructions (Signed)

## 2021-02-21 LAB — CYTOLOGY - PAP: Diagnosis: NEGATIVE

## 2021-03-26 ENCOUNTER — Encounter: Payer: Self-pay | Admitting: Obstetrics and Gynecology

## 2023-02-12 NOTE — Progress Notes (Addendum)
77 y.o. G0P0 Single Caucasian female here for annual exam.    Some urgency to void.  Coffee 2 - 3 per day.   Some sleeplessness and restlessness related to bereavement.   Mother passed in 07-03-2022.  Her father died 2 years prior.  [place]  Has support of her extended family.   PCP:   Alfonzo Beers, MD - [facility]:  Dr. Horald Pollen  Patient's last menstrual period was 05/19/1998 (within years).           Sexually active: No.  The current method of family planning is post menopausal status.    Exercising: No. Smoker:  no  Health Maintenance: Pap:  02/20/21 neg, 12/21/18  Negative, 10/17/16  Negative,  10-04-14 Negative History of abnormal Pap:  yes MMG:  09/15/22 per pt, 03/26/21 Breast Density Cat A, BI-RADS CAT 1 neg Colonoscopy:  10/25/15 Normal - Dr Matthias Hughs.  Had a flexible sigmoidoscopy in 2019 and had polyps per patient.  Now sees GI in [place]. BMD:   2024 osteopenia per pt - did through a study protocol, 12/15/16  Result  WNL TDaP:  10/21/17 Gardasil:   no HIV: 10/17/15 neg Hep C: 10/17/15 neg Screening Labs:  PCP   reports that she has never smoked. She has never used smokeless tobacco. She reports that she does not currently use alcohol. She reports that she does not use drugs.  Past Medical History:  Diagnosis Date   Colitis    Elevated cholesterol    GERD (gastroesophageal reflux disease)    Hypothyroidism 2006   Osteopenia 2005    Past Surgical History:  Procedure Laterality Date   colonoscopy  07/2010   normal    OVARIAN CYST REMOVAL  1982   benign   THYROIDECTOMY  2007    Current Outpatient Medications  Medication Sig Dispense Refill   COD LIVER OIL PO Take 1 tablet by mouth daily.      dexlansoprazole (DEXILANT) 60 MG capsule 1 capsule     ezetimibe-simvastatin (VYTORIN) 10-40 MG tablet 1 tablet     levocetirizine (XYZAL) 5 MG tablet Take 5 mg by mouth every evening.     levothyroxine (SYNTHROID) 112 MCG tablet Take  100 mcg by mouth daily.     Misc Natural Products (OSTEO BI-FLEX ADV DOUBLE ST PO) Take by mouth daily.     Multiple Vitamins-Minerals (MULTIVITAMIN PO) Take by mouth daily.     ondansetron (ZOFRAN) 4 MG tablet Take 1 tablet (4 mg total) by mouth every 6 (six) hours. 12 tablet 0   Probiotic Product (PROBIOTIC ADVANCED PO) Take by mouth.     Vitamin D, Ergocalciferol, (DRISDOL) 50000 UNITS CAPS capsule Take 50,000 Units by mouth every 7 (seven) days.     No current facility-administered medications for this visit.    Family History  Problem Relation Age of Onset   Heart failure Mother    Melanoma Father    Osteoarthritis Father    Heart attack Brother    Esophageal cancer Maternal Aunt    Breast cancer Maternal Grandmother    Kidney disease Maternal Grandfather    Colon cancer Paternal Grandfather     Review of Systems  All other systems reviewed and are negative.   Exam:   BP 136/78 (BP Location: Left Arm, Patient Position: Sitting, Cuff Size: Normal)   Pulse 78   Ht 5\' 1"  (1.549 m)   Wt 192 lb (87.1 kg)   LMP 05/19/1998 (Within Years)  SpO2 97%   BMI 36.28 kg/m     General appearance: alert, cooperative and appears stated age Head: normocephalic, without obvious abnormality, atraumatic Neck: no adenopathy, supple, symmetrical, trachea midline and thyroid normal to inspection and palpation Lungs: clear to auscultation bilaterally Breasts: normal appearance, no masses or tenderness, No nipple retraction or dimpling, No nipple discharge or bleeding, No axillary adenopathy Heart: regular rate and rhythm Abdomen: soft, non-tender; no masses, no organomegaly Extremities: extremities normal, atraumatic, no cyanosis or edema Skin: skin color, texture, turgor normal. No rashes or lesions Lymph nodes: cervical, supraclavicular, and axillary nodes normal. Neurologic: grossly normal  Pelvic: External genitalia:  no lesions              No abnormal inguinal nodes palpated.               Urethra:  normal appearing urethra with no masses, tenderness or lesions              Bartholins and Skenes: normal                 Vagina: normal appearing vagina with normal color and discharge, no lesions              Cervix: no lesions.  Atrophy noted.               Pap taken: yes Bimanual Exam:  Uterus:  normal size, contour, position, consistency, mobility, non-tender              Adnexa: no mass, fullness, tenderness              Rectal exam: yes.  Confirms.              Anus:  normal sphincter tone, no lesions  Chaperone was present for exam:  Warren Lacy, CMA  Assessment:   Well woman visit with gynecologic exam. Cervical cancer screening.  Bereavement.  Osteopenia.  Urinary urgency.  Hx diverticulitis.   Plan: Mammogram screening discussed.  Will get copies of her last 2 mammograms from [facility].  Self breast awareness reviewed. Pap and HR HPV collected.  Guidelines for Calcium, Vitamin D, regular exercise program including cardiovascular and weight bearing exercise. Support given for the loss of her parents.  We discussed Hospice, Grief Share, and spiritual community support for her losses.  She will do a bone density in April 2025 at [facility] at the time of her mammogram. We discussed reduction of caffeine to help with urinary urgency/frequency.  Follow up in 2 years and prn.   30 min  total time was spent for this patient encounter, including preparation, face-to-face counseling with the patient, coordination of care, and documentation of the encounter in addition to doing the breast and pelvic exam and pap.

## 2023-02-23 ENCOUNTER — Encounter: Payer: Self-pay | Admitting: Obstetrics and Gynecology

## 2023-02-23 ENCOUNTER — Other Ambulatory Visit (HOSPITAL_COMMUNITY)
Admission: RE | Admit: 2023-02-23 | Discharge: 2023-02-23 | Disposition: A | Discharge status: Home / Self Care | Payer: Medicare PPO | Source: Ambulatory Visit | Attending: Obstetrics and Gynecology | Admitting: Obstetrics and Gynecology

## 2023-02-23 ENCOUNTER — Ambulatory Visit (INDEPENDENT_AMBULATORY_CARE_PROVIDER_SITE_OTHER): Payer: Medicare PPO | Admitting: Obstetrics and Gynecology

## 2023-02-23 VITALS — BP 136/78 | HR 78 | Ht 61.0 in | Wt 192.0 lb

## 2023-02-23 DIAGNOSIS — R3915 Urgency of urination: Secondary | ICD-10-CM | POA: Diagnosis not present

## 2023-02-23 DIAGNOSIS — Z1151 Encounter for screening for human papillomavirus (HPV): Secondary | ICD-10-CM | POA: Insufficient documentation

## 2023-02-23 DIAGNOSIS — Z124 Encounter for screening for malignant neoplasm of cervix: Secondary | ICD-10-CM

## 2023-02-23 DIAGNOSIS — Z01419 Encounter for gynecological examination (general) (routine) without abnormal findings: Secondary | ICD-10-CM | POA: Insufficient documentation

## 2023-02-23 DIAGNOSIS — Z634 Disappearance and death of family member: Secondary | ICD-10-CM

## 2023-02-23 DIAGNOSIS — M8580 Other specified disorders of bone density and structure, unspecified site: Secondary | ICD-10-CM | POA: Diagnosis not present

## 2023-02-23 NOTE — Patient Instructions (Signed)

## 2023-02-26 LAB — CYTOLOGY - PAP
Comment: NEGATIVE
Diagnosis: NEGATIVE
High risk HPV: NEGATIVE
